# Patient Record
Sex: Female | Born: 1935 | Race: White | Hispanic: No | Marital: Single
Health system: Southern US, Community
[De-identification: ages and names within clinical notes are randomized; demographics above are authoritative.]

## PROBLEM LIST (undated history)

## (undated) DIAGNOSIS — M797 Fibromyalgia: Secondary | ICD-10-CM

## (undated) DIAGNOSIS — F0390 Unspecified dementia without behavioral disturbance: Secondary | ICD-10-CM

## (undated) DIAGNOSIS — E119 Type 2 diabetes mellitus without complications: Secondary | ICD-10-CM

## (undated) HISTORY — PX: ROTATOR CUFF REPAIR: SHX139

---

## 2000-05-23 ENCOUNTER — Encounter: Payer: Self-pay | Admitting: Emergency Medicine

## 2000-05-23 ENCOUNTER — Emergency Department (HOSPITAL_COMMUNITY): Admission: EM | Admit: 2000-05-23 | Discharge: 2000-05-23 | Payer: Self-pay | Admitting: Emergency Medicine

## 2009-12-26 ENCOUNTER — Inpatient Hospital Stay (HOSPITAL_COMMUNITY): Admission: AD | Admit: 2009-12-26 | Discharge: 2009-12-31 | Payer: Self-pay

## 2010-06-19 LAB — CBC
HCT: 35.1 % — ABNORMAL LOW (ref 36.0–46.0)
Hemoglobin: 11.5 g/dL — ABNORMAL LOW (ref 12.0–15.0)
MCHC: 32.8 g/dL (ref 30.0–36.0)
MCHC: 33.3 g/dL (ref 30.0–36.0)
MCV: 83.2 fL (ref 78.0–100.0)
Platelets: 288 10*3/uL (ref 150–400)
RBC: 4.19 MIL/uL (ref 3.87–5.11)
RDW: 12.6 % (ref 11.5–15.5)
WBC: 13.6 10*3/uL — ABNORMAL HIGH (ref 4.0–10.5)

## 2010-06-19 LAB — GLUCOSE, CAPILLARY
Glucose-Capillary: 107 mg/dL — ABNORMAL HIGH (ref 70–99)
Glucose-Capillary: 111 mg/dL — ABNORMAL HIGH (ref 70–99)
Glucose-Capillary: 131 mg/dL — ABNORMAL HIGH (ref 70–99)
Glucose-Capillary: 136 mg/dL — ABNORMAL HIGH (ref 70–99)
Glucose-Capillary: 139 mg/dL — ABNORMAL HIGH (ref 70–99)
Glucose-Capillary: 141 mg/dL — ABNORMAL HIGH (ref 70–99)
Glucose-Capillary: 145 mg/dL — ABNORMAL HIGH (ref 70–99)
Glucose-Capillary: 146 mg/dL — ABNORMAL HIGH (ref 70–99)
Glucose-Capillary: 151 mg/dL — ABNORMAL HIGH (ref 70–99)
Glucose-Capillary: 174 mg/dL — ABNORMAL HIGH (ref 70–99)
Glucose-Capillary: 193 mg/dL — ABNORMAL HIGH (ref 70–99)

## 2010-06-19 LAB — BASIC METABOLIC PANEL
BUN: 12 mg/dL (ref 6–23)
BUN: 7 mg/dL (ref 6–23)
BUN: 9 mg/dL (ref 6–23)
BUN: 9 mg/dL (ref 6–23)
CO2: 29 mEq/L (ref 19–32)
Chloride: 100 mEq/L (ref 96–112)
Chloride: 93 mEq/L — ABNORMAL LOW (ref 96–112)
Chloride: 94 mEq/L — ABNORMAL LOW (ref 96–112)
Creatinine, Ser: 0.58 mg/dL (ref 0.4–1.2)
Creatinine, Ser: 0.63 mg/dL (ref 0.4–1.2)
GFR calc Af Amer: >60 mL/min (ref >60)
GFR calc Af Amer: >60 mL/min (ref >60)
GFR calc non Af Amer: >60 mL/min (ref >60)
GFR calc non Af Amer: >60 mL/min (ref >60)
GFR calc non Af Amer: >60 mL/min (ref >60)
GFR calc non Af Amer: >60 mL/min (ref >60)
Glucose, Bld: 113 mg/dL — ABNORMAL HIGH (ref 70–99)
Glucose, Bld: 121 mg/dL — ABNORMAL HIGH (ref 70–99)
Glucose, Bld: 138 mg/dL — ABNORMAL HIGH (ref 70–99)
Potassium: 3.7 mEq/L (ref 3.5–5.1)
Potassium: 3.7 mEq/L (ref 3.5–5.1)
Potassium: 4 mEq/L (ref 3.5–5.1)
Potassium: 4.6 mEq/L (ref 3.5–5.1)
Sodium: 133 mEq/L — ABNORMAL LOW (ref 135–145)
Sodium: 135 mEq/L (ref 135–145)

## 2010-06-19 LAB — MRSA PCR SCREENING: MRSA by PCR: NEGATIVE

## 2010-08-22 NOTE — Consult Note (Signed)
[place]  Patient:    Marilyn Stone, Marilyn Stone                       MRN: [id number] Proc. Date: 05/23/00 Attending:  Marlan Palau, M.D.                          Consultation Report  HISTORY OF PRESENT ILLNESS:  Marilyn Stone is a 76 year old right-handed white female, born Jan 04, 1929 with a history of obesity and hypertension.  This patient comes to the [facility] for an evaluation of an episode that occurred this evening while at a restaurant. The patient actually was having lunch at a steakhouse around 1 p.m. today. She walked into the restaurant, but apparently tripped on the way to the table.  The patient did a split with the left leg behind her, stretching the hamstrings with significant pain in the thigh.  The patient needed assistance to get up after the fall, was able to limp over to a booth, and five minutes later, developed problems with feeling light-headed, blanched in the face, and some perioral paraseizures as if her mouth felt like it was "shot with Novocaine."  Around this time, patient was slurring her speech with no obvious facial droop apparent to the family.  Patient did not have any numbness or weakness on the arms or legs at this time, did not black out.  The event resolved in about five minutes, but patient had associated diaphoresis with the event without chest pains.  Patient has been completely normal since that time and comes to the emergency room for an evaluation.  CT scan of the brain is unremarkable.  Neurology was asked to see the patient for further evaluation.  PAST MEDICAL HISTORY: 1. History of hypertension. 2. History of fibromyalgia. 3. History of obesity. 4. Status post hysterectomy. 5. History of left rotator cuff tear repair in the past.  MEDICATIONS: 1. Vasotec 5 mg b.i.d. 2. Amitriptyline 50 mg q.h.s. 3. Vicodin at night.  ALLERGIES:  Patient states an allergy to STREPTOMYCIN and  PARAFON FORTE.  HABITS:  He does not smoke or drink.  SOCIAL HISTORY:  Patient is married, lives in [place], [place] area. She has two sons, one son with an unknown liver disease and one son with hypertension.  FAMILY MEDICAL HISTORY:  Notable that mother died with an MI.  Father died with cancer of the liver.  Patient has one sister that died at age 54 with congestive heart failure, one brother alive at age 60 with history of coronary artery disease.  REVIEW OF SYSTEMS:  Notable for no fevers or chills.  Patient denies chest pains, palpitations, denies headache, visual field changes, has occasional shortness of breath, has some recent diarrhea last evening.  She has frequency of the bladder, no bladder control problems.  PHYSICAL EXAMINATION:  VITAL SIGNS:  Blood pressure is 137/47, heart rate is 92, respiratory rate 18, temperature afebrile.  GENERAL:  In general the patient is a moderately obese white female who was alert and cooperative at the time of the examination.  HEENT:  Head is atraumatic.  Eyes:  Pupils are equal, round, and reactive to light.  Discs soft, flat bilaterally.  NECK:  Supple, no carotid bruits noted.  RESPIRATORY:  Clear to auscultation and percussion.  CARDIOVASCULAR:  Reveals regular rate and rhythm without obvious murmurs or rubs noted.  EXTREMITIES:  Without significant  edema.  NEUROLOGIC:  Cranial nerves as above.  Facial symmetry is present.  Patient has good sensation to pinprick and soft touch bilaterally.  Patient has good strength of facial muscles and the muscles of head turning and shoulder shrug bilaterally.  Patient has a normal speech pattern, no aphasia.  Motor test reveals 5/5 strength in all fours.  Good symmetric motility _________ . Sensory testing was intact to pinprick and soft touch bilaterally, _________ throughout.  Finger-nose-finger and toe-to-finger symmetric and normal.  Deep tendon reflexes repressed but  symmetric.  Toes neutral to downgoing bilaterally.  No pronator drift again is seen.  LABORATORY VALUES:  Notable for white count of 9.8, hemoglobin of 12.2, hematocrit 36.7, MCV of 8, platelets of 384.  Pro time is INR of 1.1.  EKG reveals normal sinus rhythm, anterior infarct, age undetermined, heart rate of 98.  CT scan of the head is unremarkable without contrast.  IMPRESSION: 1. History of recent fall. 2. Near syncopal event. 3. History of hypertension.  This patient is normal neurologic at this point.  By history, the patient fell and sustained a stretch injury to the hamstring muscles on the left thigh. Patient then subsequently had a presyncopal event associated with light-headed sensations, blanching of the face, diaphoresis, perioral numbness.  Slurred speech was part of this process.  The episode completely resolved in 5-10 minutes.  The patient is now back to normal.  My index of suspicion for TIA is quite low at this point.  Would discharge the patient back home tonight.  Will pursue an outpatient workup for an MRI scan and MRI angiogram.  Patient did not have slurred speech initially after the fall.  The patient did not have any weakness or numbness of the extremities.  The presyncopal event was associated with significant pain of the left thigh.  PLAN: 1. Discharge to home tonight. 2. Continue taking an aspirin a day. 3. MRI scan of the brain, MRI injury on his outpatient. 4. Will follow up with [facility] in three weeks following    discharge. DD:  05/23/00 TD:  05/23/00 Job: [id number]

## 2012-11-09 ENCOUNTER — Encounter (HOSPITAL_COMMUNITY): Payer: Self-pay | Admitting: Emergency Medicine

## 2012-11-09 ENCOUNTER — Emergency Department (HOSPITAL_COMMUNITY): Payer: Medicare PPO

## 2012-11-09 ENCOUNTER — Inpatient Hospital Stay (HOSPITAL_COMMUNITY)
Admission: EM | Admit: 2012-11-09 | Discharge: 2012-11-11 | DRG: 684 | Disposition: A | Discharge status: Home / Self Care | Payer: Medicare PPO | Attending: Internal Medicine | Admitting: Internal Medicine

## 2012-11-09 ENCOUNTER — Inpatient Hospital Stay (HOSPITAL_COMMUNITY): Payer: Medicare PPO

## 2012-11-09 DIAGNOSIS — IMO0001 Reserved for inherently not codable concepts without codable children: Secondary | ICD-10-CM | POA: Diagnosis present

## 2012-11-09 DIAGNOSIS — R197 Diarrhea, unspecified: Secondary | ICD-10-CM | POA: Diagnosis present

## 2012-11-09 DIAGNOSIS — I959 Hypotension, unspecified: Secondary | ICD-10-CM | POA: Diagnosis present

## 2012-11-09 DIAGNOSIS — N179 Acute kidney failure, unspecified: Principal | ICD-10-CM | POA: Diagnosis present

## 2012-11-09 DIAGNOSIS — E860 Dehydration: Secondary | ICD-10-CM | POA: Diagnosis present

## 2012-11-09 DIAGNOSIS — R55 Syncope and collapse: Secondary | ICD-10-CM | POA: Diagnosis present

## 2012-11-09 DIAGNOSIS — K219 Gastro-esophageal reflux disease without esophagitis: Secondary | ICD-10-CM | POA: Diagnosis present

## 2012-11-09 DIAGNOSIS — E119 Type 2 diabetes mellitus without complications: Secondary | ICD-10-CM | POA: Diagnosis present

## 2012-11-09 DIAGNOSIS — Z87820 Personal history of traumatic brain injury: Secondary | ICD-10-CM

## 2012-11-09 DIAGNOSIS — F0390 Unspecified dementia without behavioral disturbance: Secondary | ICD-10-CM | POA: Diagnosis present

## 2012-11-09 HISTORY — DX: Fibromyalgia: M79.7

## 2012-11-09 HISTORY — DX: Type 2 diabetes mellitus without complications: E11.9

## 2012-11-09 HISTORY — DX: Unspecified dementia, unspecified severity, without behavioral disturbance, psychotic disturbance, mood disturbance, and anxiety: F03.90

## 2012-11-09 LAB — CBC WITH DIFFERENTIAL/PLATELET
Eosinophils Relative: 1 % (ref 0–5)
HCT: 30.8 % — ABNORMAL LOW (ref 36.0–46.0)
Lymphocytes Relative: 21 % (ref 12–46)
Lymphs Abs: 2.2 10*3/uL (ref 0.7–4.0)
MCV: 82.8 fL (ref 78.0–100.0)
Monocytes Absolute: 0.6 10*3/uL (ref 0.1–1.0)
RBC: 3.72 MIL/uL — ABNORMAL LOW (ref 3.87–5.11)
WBC: 10.5 10*3/uL (ref 4.0–10.5)

## 2012-11-09 LAB — URINE MICROSCOPIC-ADD ON

## 2012-11-09 LAB — COMPREHENSIVE METABOLIC PANEL
ALT: 15 U/L (ref 0–35)
Alkaline Phosphatase: 61 U/L (ref 39–117)
BUN: 43 mg/dL — ABNORMAL HIGH (ref 6–23)
CO2: 24 mEq/L (ref 19–32)
GFR calc Af Amer: 35 mL/min — ABNORMAL LOW (ref >90)
GFR calc non Af Amer: 30 mL/min — ABNORMAL LOW (ref >90)
Glucose, Bld: 203 mg/dL — ABNORMAL HIGH (ref 70–99)
Potassium: 3.6 mEq/L (ref 3.5–5.1)
Sodium: 138 mEq/L (ref 135–145)
Total Bilirubin: 0.1 mg/dL — ABNORMAL LOW (ref 0.3–1.2)

## 2012-11-09 LAB — URINALYSIS, ROUTINE W REFLEX MICROSCOPIC
Bilirubin Urine: NEGATIVE
Ketones, ur: NEGATIVE mg/dL
Nitrite: NEGATIVE
Protein, ur: NEGATIVE mg/dL
Urobilinogen, UA: 0.2 mg/dL (ref 0.0–1.0)

## 2012-11-09 MED ORDER — DONEPEZIL HCL 10 MG PO TABS
10.0000 mg | ORAL_TABLET | Freq: Every day | ORAL | Status: DC
  Administered 2012-11-10 – 2012-11-11 (×2): 10 mg via ORAL
  Filled 2012-11-09 (×2): qty 1

## 2012-11-09 MED ORDER — HYDROCODONE-ACETAMINOPHEN 5-325 MG PO TABS
1.0000 | ORAL_TABLET | Freq: Every day | ORAL | Status: DC
  Administered 2012-11-09 – 2012-11-10 (×2): 1 via ORAL
  Filled 2012-11-09 (×2): qty 1

## 2012-11-09 MED ORDER — ONDANSETRON HCL 4 MG/2ML IJ SOLN: 4.0000 mg | Freq: Three times a day (TID) | INTRAMUSCULAR | Status: DC | PRN

## 2012-11-09 MED ORDER — HYDROCODONE-ACETAMINOPHEN 5-325 MG PO TABS: 1.0000 | ORAL_TABLET | Freq: Four times a day (QID) | ORAL | Status: DC | PRN

## 2012-11-09 MED ORDER — ENOXAPARIN SODIUM 30 MG/0.3ML ~~LOC~~ SOLN
30.0000 mg | SUBCUTANEOUS | Status: DC
  Administered 2012-11-09 – 2012-11-10 (×2): 30 mg via SUBCUTANEOUS
  Filled 2012-11-09 (×3): qty 0.3

## 2012-11-09 MED ORDER — METOPROLOL SUCCINATE ER 50 MG PO TB24
50.0000 mg | ORAL_TABLET | Freq: Two times a day (BID) | ORAL | Status: DC
  Administered 2012-11-10 – 2012-11-11 (×3): 50 mg via ORAL
  Filled 2012-11-09 (×4): qty 1

## 2012-11-09 MED ORDER — SODIUM CHLORIDE 0.9 % IV SOLN
INTRAVENOUS | Status: DC
  Administered 2012-11-09 – 2012-11-10 (×2): via INTRAVENOUS

## 2012-11-09 MED ORDER — LINAGLIPTIN 5 MG PO TABS
5.0000 mg | ORAL_TABLET | Freq: Every day | ORAL | Status: DC
  Administered 2012-11-10 – 2012-11-11 (×2): 5 mg via ORAL
  Filled 2012-11-09 (×2): qty 1

## 2012-11-09 MED ORDER — ASPIRIN 81 MG PO CHEW
81.0000 mg | CHEWABLE_TABLET | Freq: Every day | ORAL | Status: DC
  Administered 2012-11-09 – 2012-11-11 (×3): 81 mg via ORAL
  Filled 2012-11-09 (×3): qty 1

## 2012-11-09 MED ORDER — ONDANSETRON HCL 4 MG PO TABS: 4.0000 mg | ORAL_TABLET | Freq: Four times a day (QID) | ORAL | Status: DC | PRN

## 2012-11-09 MED ORDER — SODIUM CHLORIDE 0.9 % IV SOLN
1000.0000 mL | INTRAVENOUS | Status: DC
  Administered 2012-11-09: 1000 mL via INTRAVENOUS

## 2012-11-09 MED ORDER — ACETAMINOPHEN 325 MG PO TABS: 650.0000 mg | ORAL_TABLET | Freq: Four times a day (QID) | ORAL | Status: DC | PRN

## 2012-11-09 MED ORDER — MECLIZINE HCL 25 MG PO TABS
25.0000 mg | ORAL_TABLET | Freq: Three times a day (TID) | ORAL | Status: DC
  Administered 2012-11-09 – 2012-11-11 (×5): 25 mg via ORAL
  Filled 2012-11-09 (×7): qty 1

## 2012-11-09 MED ORDER — SIMVASTATIN 20 MG PO TABS
20.0000 mg | ORAL_TABLET | Freq: Every day | ORAL | Status: DC
  Administered 2012-11-10: 20 mg via ORAL
  Filled 2012-11-09 (×2): qty 1

## 2012-11-09 MED ORDER — CITALOPRAM HYDROBROMIDE 40 MG PO TABS
40.0000 mg | ORAL_TABLET | Freq: Every day | ORAL | Status: DC
  Administered 2012-11-10 – 2012-11-11 (×2): 40 mg via ORAL
  Filled 2012-11-09 (×3): qty 1

## 2012-11-09 MED ORDER — PANTOPRAZOLE SODIUM 40 MG PO TBEC
40.0000 mg | DELAYED_RELEASE_TABLET | Freq: Every day | ORAL | Status: DC
  Administered 2012-11-10 – 2012-11-11 (×2): 40 mg via ORAL
  Filled 2012-11-09 (×2): qty 1

## 2012-11-09 MED ORDER — ACETAMINOPHEN 650 MG RE SUPP: 650.0000 mg | Freq: Four times a day (QID) | RECTAL | Status: DC | PRN

## 2012-11-09 MED ORDER — SODIUM CHLORIDE 0.9 % IJ SOLN
3.0000 mL | Freq: Two times a day (BID) | INTRAMUSCULAR | Status: DC
  Administered 2012-11-10: 3 mL via INTRAVENOUS

## 2012-11-09 MED ORDER — INSULIN ASPART 100 UNIT/ML ~~LOC~~ SOLN
0.0000 [IU] | Freq: Three times a day (TID) | SUBCUTANEOUS | Status: DC
  Administered 2012-11-10: 2 [IU] via SUBCUTANEOUS
  Administered 2012-11-10 – 2012-11-11 (×2): 1 [IU] via SUBCUTANEOUS

## 2012-11-09 MED ORDER — GLIMEPIRIDE 4 MG PO TABS
4.0000 mg | ORAL_TABLET | Freq: Two times a day (BID) | ORAL | Status: DC
  Administered 2012-11-10 – 2012-11-11 (×3): 4 mg via ORAL
  Filled 2012-11-09 (×6): qty 1

## 2012-11-09 MED ORDER — ONDANSETRON HCL 4 MG/2ML IJ SOLN: 4.0000 mg | Freq: Four times a day (QID) | INTRAMUSCULAR | Status: DC | PRN

## 2012-11-09 MED ORDER — MEMANTINE HCL ER 28 MG PO CP24
28.0000 mg | ORAL_CAPSULE | Freq: Every day | ORAL | Status: DC
  Administered 2012-11-09 – 2012-11-11 (×3): 28 mg via ORAL
  Filled 2012-11-09 (×3): qty 28

## 2012-11-09 NOTE — Progress Notes (Signed)
Pt admitted to unit from ED. Pt is A&O, VS stable, skin intact. Pt is currently resting comfortably in bed. Will continue to monitor.  

## 2012-11-09 NOTE — ED Notes (Signed)
Per EMS: pt went to bathroom at DRs office; pt had episode of diarrhea and came out very white and had near syncopal episode; pts daughter in law caught her and helped her to ground. Pt did not hit head and did not have total LOC. Pt c/o nausea, diarrhea, and abdominal pain with dizziness. Pt was in trendelenburg upon EMS arrival and pt was very pale upon arrival. VS BP 107/47 HR 64 97% Spo2 CBG 144

## 2012-11-09 NOTE — ED Notes (Signed)
MD at bedside. 

## 2012-11-09 NOTE — H&P (Addendum)
[facility] History and Physical  Marilyn Stone WUJ:[id number] DOB: 09-06-1928 DOA: 11/09/2012  Referring physician:  PCP: Paulina Fusi, MD  Specialists:   Chief Complaint: diarrhea and presyncope  HPI: Marilyn Stone is a 79 y.o. female with PMH as listed below who presents with the above complaints. She states that today she had a nonbloody diarrheal BM and following that she was felt light headed, diaphoretic and 'slid down to the floor'. She states she never lost consciousness. She admits to lower abd pain after the BM. She denies vomiting, chest pain, fever and no dysuria. Per EDP pt was hypotensive with BP in the 80s when EMS arrived. She was given IVF and upon arrival in ED BP was in the 120s and remained stable. In the ED labs revealed an elevated cr of 1.57, CXR neg, EKG with nonspecific t wave changes mostly laterally, troponin neg. She is admitted for further eval and management    Review of Systems: The patient denies anorexia, fever, weight loss,, vision loss, decreased hearing, hoarseness, chest pain, syncope, dyspnea on exertion, peripheral edema, balance deficits, hemoptysis, melena, hematochezia, severe indigestion/heartburn, hematuria, incontinence, suspicious skin lesions, transient blindness, , unusual weight change, abnormal bleeding, enlarged lymph nodes, angioedema, and breast masses.    Past Medical History  Diagnosis Date  . Diabetes mellitus without complication   . Dementia   . Fibromyalgia    Past Surgical History  Procedure Laterality Date  . Rotator cuff repair     Social History:  has no tobacco, alcohol, and drug history on file. where does patient live--home   No Known Allergies  Family history-  her mother had stroke  Prior to Admission medications   Medication Sig Start Date End Date Taking? Authorizing Provider  citalopram (CELEXA) 40 MG tablet Take 40 mg by mouth daily.   Yes Historical Provider, MD  Cyanocobalamin 1000 MCG/ML LIQD  Take 1,000 mcg/mL by mouth every 30 (thirty) days.   Yes Historical Provider, MD  donepezil (ARICEPT) 10 MG tablet Take 10 mg by mouth daily.   Yes Historical Provider, MD  furosemide (LASIX) 40 MG tablet Take 40 mg by mouth daily.   Yes Historical Provider, MD  glimepiride (AMARYL) 4 MG tablet Take 4 mg by mouth 2 (two) times daily.   Yes Historical Provider, MD  glucosamine-chondroitin 500-400 MG tablet Take 1 tablet by mouth 4 (four) times daily.   Yes Historical Provider, MD  HYDROcodone-acetaminophen (NORCO/VICODIN) 5-325 MG per tablet Take 1 tablet by mouth at bedtime.   Yes Historical Provider, MD  ibuprofen (ADVIL,MOTRIN) 200 MG tablet Take 200 mg by mouth every 6 (six) hours as needed for pain.   Yes Historical Provider, MD  lovastatin (MEVACOR) 40 MG tablet Take 40 mg by mouth 2 (two) times daily.   Yes Historical Provider, MD  meclizine (ANTIVERT) 25 MG tablet Take 25 mg by mouth 3 (three) times daily.   Yes Historical Provider, MD  Memantine HCl ER (NAMENDA XR) 28 MG CP24 Take 28 mg by mouth daily.   Yes Historical Provider, MD  metFORMIN (GLUMETZA) 500 MG (MOD) 24 hr tablet Take 500 mg by mouth daily with breakfast.   Yes Historical Provider, MD  metoprolol succinate (TOPROL-XL) 100 MG 24 hr tablet Take 100 mg by mouth 2 (two) times daily. Take with or immediately following a meal.   Yes Historical Provider, MD  Multiple Vitamins-Minerals (CENTRUM SILVER PO) Take 1 tablet by mouth daily.   Yes Historical Provider, MD  Omega-3 Fatty Acids (FISH  OIL PO) Take 1 tablet by mouth daily.   Yes Historical Provider, MD  omeprazole (PRILOSEC) 20 MG capsule Take 20 mg by mouth daily.   Yes Historical Provider, MD  polyethylene glycol (MIRALAX / GLYCOLAX) packet Take 17 g by mouth daily as needed (constipation).   Yes Historical Provider, MD  sitaGLIPtin (JANUVIA) 100 MG tablet Take 50 mg by mouth daily.   Yes Historical Provider, MD  valsartan-hydrochlorothiazide (DIOVAN-HCT) 320-25 MG per tablet  Take 1 tablet by mouth daily.   Yes Historical Provider, MD   Physical Exam: Filed Vitals:   11/09/12 1845  BP: 120/55  Pulse: 65  Temp:   Resp:     Constitutional: Vital signs reviewed.  Patient is well-developed and obese in no acute distress and cooperative with exam. Alert and oriented x3.  Head: Normocephalic and atraumatic Nose: No erythema or drainage noted. Mouth: no erythema or exudates, slightly dry MM Eyes: PERRL, EOMI, conjunctivae normal, No scleral icterus.  Neck: Supple, Trachea midline normal ROM, No JVD, mass, thyromegaly, or carotid bruit present.  Cardiovascular: RRR, S1 normal, S2 normal, no MRG, pulses symmetric and intact bilaterally Pulmonary/Chest: normal respiratory effort, CTAB, no wheezes, rales, or rhonchi Abdominal: Soft. Lower abd tenderness, no rebound, non-distended, bowel sounds are normal, no masses, organomegaly, or guarding present.  GU: no CVA tenderness Extremities:no cyanosis and no edema Neurological: A&O x3, Strength is normal and symmetric bilaterally, cranial nerve II-XII are grossly intact, no focal motor deficit, sensory intact to light touch bilaterally.  Skin: Warm, dry and intact. No rash.  Psychiatric: Normal mood and affect.   Labs on Admission:  Basic Metabolic Panel:  Recent Labs Lab 11/09/12 1706  NA 138  K 3.6  CL 96  CO2 24  GLUCOSE 203*  BUN 43*  CREATININE 1.57*  CALCIUM 9.2   Liver Function Tests:  Recent Labs Lab 11/09/12 1706  AST 24  ALT 15  ALKPHOS 61  BILITOT 0.1*  PROT 7.3  ALBUMIN 3.8   No results found for this basename: LIPASE, AMYLASE,  in the last 168 hours No results found for this basename: AMMONIA,  in the last 168 hours CBC:  Recent Labs Lab 11/09/12 1744  WBC 10.5  NEUTROABS 7.8*  HGB 10.3*  HCT 30.8*  MCV 82.8  PLT 280   Cardiac Enzymes: No results found for this basename: CKTOTAL, CKMB, CKMBINDEX, TROPONINI,  in the last 168 hours  BNP (last 3 results) No results found  for this basename: PROBNP,  in the last 8760 hours CBG: No results found for this basename: GLUCAP,  in the last 168 hours  Radiological Exams on Admission: Dg Chest 2 View  11/09/2012   *RADIOLOGY REPORT*  Clinical Data: Near syncopal episode  CHEST - 2 VIEW  Comparison: 08/29/2010  Findings: The heart and pulmonary vascularity are within normal limits.  The lungs are clear bilaterally.  A chronic compression deformity is noted in the lower thoracic spine.  IMPRESSION: No acute abnormality noted.   Original Report Authenticated By: Alcide Clever, M.D.    EKG: Independently reviewed. NSR mostly lateral nonspecific t wave inversion  Assessment/Plan Active Problems: Transient hypotension -likely due to hypovolemia/volume depletion due to diarrhea and meds -resolved with hydration- continue IVF - hold bp meds today, resume metoprolol at decreased dose in am, monitor and further treat accordingly -UA contaminated specimen, obtain culture and follow, she is afebrile with no leukocytosis  -cycle troponins -Hgb stable, follow   Diarrhea with abd pain -check c.diff and culture  if further diarrhea   Near syncope -likely secondary to above vs vasovagal -hydrate, CEs as above -also check echo echo and follow -obtain abd x-ray, follow and if not improving consider CT abd with contrastwhen cr improved Abnl EKG -she is cp free and troponin neg x1 -cycle troponins,ECHO in am, repeat EKG in am -possibly due to demand induced secondary to hypotension   AKI (acute kidney injury) -secondary to hypotension/vol depletion/ARB/diuretics/NSAIDS -hold metformin for now, also hold ARB/diuretics and NSAIDs -follow and recheck    DM (diabetes mellitus) -continue oral meds except for metformin(due to increased Cr) -cover with SSI -monitor accuchecks, cover with SSI Dementia -continue outpt meds GERD -continue PPI H/O BPV -continue meclizine  H/o Traumatic brain injury with interventricular  hemorrhage     Code Status: full Family Communication: none at bedside Disposition Plan:   Time spent: >30  Kela Millin [facility] Pager [phone]  If 7PM-7AM, please contact night-coverage [email or web address] Password [facility] 11/09/2012, 7:13 PM

## 2012-11-09 NOTE — ED Notes (Signed)
Paged Dr, Yetta Barre to [phone]

## 2012-11-09 NOTE — ED Provider Notes (Signed)
CSN: [id number]     Arrival date & time 11/09/12  1514 History     First MD Initiated Contact with Patient 11/09/12 1515     Chief Complaint  Patient presents with  . Near Syncope  . Nausea  . Diarrhea   (Consider location/radiation/quality/duration/timing/severity/associated sxs/prior Treatment) Patient is a 79 y.o. female presenting with diarrhea. The history is provided by the patient and medical records. No language interpreter was used.  Diarrhea Associated symptoms: abdominal pain and diaphoresis   Associated symptoms: no fever, no headaches and no vomiting     Marilyn Stone is a 79 y.o. female  with a hx of DM, dementia, fibromyalgia, hysterectomy presents to the Emergency Department complaining of acute resolved near syncopal episode approx 30 min prior to arrival after having 1 bout of diarrhea.  Pt denies feeling ill prior to the episode and has not had further or previous episodes of diarrhea.  Pr denies vomiting but endorses mild, generalized abd pain.  No abd surgical hx besides the hysterectomy.  Associated symptoms include diaphoresis, lightheadedness, weakness, nausea.  Nothing makes it better and nothing makes it worse.  Pt denies fever, chills, headache, neck pain, chest pain, SOB, vomiting, dizziness, dysuria, hematuria, hematochezia.      Past Medical History  Diagnosis Date  . Diabetes mellitus without complication   . Dementia   . Fibromyalgia    Past Surgical History  Procedure Laterality Date  . Rotator cuff repair     History reviewed. No pertinent family history. History  Substance Use Topics  . Smoking status: Not on file  . Smokeless tobacco: Not on file  . Alcohol Use: Not on file   OB History   Grav Para Term Preterm Abortions TAB SAB Ect Mult Living                 Review of Systems  Constitutional: Positive for diaphoresis. Negative for fever, appetite change, fatigue and unexpected weight change.  HENT: Negative for mouth sores and neck  stiffness.   Eyes: Negative for visual disturbance.  Respiratory: Negative for cough, chest tightness, shortness of breath and wheezing.   Cardiovascular: Negative for chest pain.  Gastrointestinal: Positive for nausea, abdominal pain and diarrhea. Negative for vomiting and constipation.  Endocrine: Negative for polydipsia, polyphagia and polyuria.  Genitourinary: Negative for dysuria, urgency, frequency and hematuria.  Musculoskeletal: Negative for back pain.  Skin: Negative for rash.  Allergic/Immunologic: Negative for immunocompromised state.  Neurological: Positive for syncope and light-headedness. Negative for headaches.  Hematological: Does not bruise/bleed easily.  Psychiatric/Behavioral: Negative for sleep disturbance. The patient is not nervous/anxious.     Allergies  Review of patient's allergies indicates no known allergies.  Home Medications   Current Outpatient Rx  Name  Route  Sig  Dispense  Refill  . citalopram (CELEXA) 40 MG tablet   Oral   Take 40 mg by mouth daily.         . Cyanocobalamin 1000 MCG/ML LIQD   Oral   Take 1,000 mcg/mL by mouth every 30 (thirty) days.         Marland Kitchen donepezil (ARICEPT) 10 MG tablet   Oral   Take 10 mg by mouth daily.         . furosemide (LASIX) 40 MG tablet   Oral   Take 40 mg by mouth daily.         Marland Kitchen glimepiride (AMARYL) 4 MG tablet   Oral   Take 4 mg by mouth 2 (two)  times daily.         Marland Kitchen glucosamine-chondroitin 500-400 MG tablet   Oral   Take 1 tablet by mouth 4 (four) times daily.         Marland Kitchen HYDROcodone-acetaminophen (NORCO/VICODIN) 5-325 MG per tablet   Oral   Take 1 tablet by mouth at bedtime.         Marland Kitchen ibuprofen (ADVIL,MOTRIN) 200 MG tablet   Oral   Take 200 mg by mouth every 6 (six) hours as needed for pain.         Marland Kitchen lovastatin (MEVACOR) 40 MG tablet   Oral   Take 40 mg by mouth 2 (two) times daily.         . meclizine (ANTIVERT) 25 MG tablet   Oral   Take 25 mg by mouth 3 (three)  times daily.         . Memantine HCl ER (NAMENDA XR) 28 MG CP24   Oral   Take 28 mg by mouth daily.         . metFORMIN (GLUMETZA) 500 MG (MOD) 24 hr tablet   Oral   Take 500 mg by mouth daily with breakfast.         . metoprolol succinate (TOPROL-XL) 100 MG 24 hr tablet   Oral   Take 100 mg by mouth 2 (two) times daily. Take with or immediately following a meal.         . Multiple Vitamins-Minerals (CENTRUM SILVER PO)   Oral   Take 1 tablet by mouth daily.         . Omega-3 Fatty Acids (FISH OIL PO)   Oral   Take 1 tablet by mouth daily.         Marland Kitchen omeprazole (PRILOSEC) 20 MG capsule   Oral   Take 20 mg by mouth daily.         . polyethylene glycol (MIRALAX / GLYCOLAX) packet   Oral   Take 17 g by mouth daily as needed (constipation).         . sitaGLIPtin (JANUVIA) 100 MG tablet   Oral   Take 50 mg by mouth daily.         . valsartan-hydrochlorothiazide (DIOVAN-HCT) 320-25 MG per tablet   Oral   Take 1 tablet by mouth daily.          BP 114/49  Pulse 70  Temp(Src) 98.1 F (36.7 C) (Oral)  Resp 14  SpO2 99% Physical Exam  Nursing note and vitals reviewed. Constitutional: She appears well-developed and well-nourished. No distress.  Awake, alert No longer diaphoretic, but still wet from diaphoretic episode  HENT:  Head: Normocephalic and atraumatic.  Right Ear: External ear normal.  Left Ear: External ear normal.  Mouth/Throat: Oropharynx is clear and moist. No oropharyngeal exudate.  Eyes: Conjunctivae and EOM are normal. Pupils are equal, round, and reactive to light. No scleral icterus.  Neck: Normal range of motion. Neck supple.  Cardiovascular: Normal rate, regular rhythm, normal heart sounds and intact distal pulses.   No murmur heard. Pulmonary/Chest: Effort normal and breath sounds normal. No respiratory distress. She has no wheezes. She has no rales.  Abdominal: Soft. Bowel sounds are normal. She exhibits no distension, no  pulsatile midline mass and no mass. There is generalized tenderness (mild, generalized). There is no rigidity, no rebound, no guarding and no CVA tenderness.  Musculoskeletal: Normal range of motion. She exhibits no edema.  Lymphadenopathy:    She has no cervical adenopathy.  Neurological: She is alert. She has normal strength. No cranial nerve deficit or sensory deficit. GCS eye subscore is 4. GCS verbal subscore is 5. GCS motor subscore is 6.  Speech is clear and goal oriented Moves extremities without ataxia  Skin: Skin is warm and dry. She is not diaphoretic.  Psychiatric: She has a normal mood and affect.    ED Course   Procedures (including critical care time)  Labs Reviewed  COMPREHENSIVE METABOLIC PANEL - Abnormal; Notable for the following:    Glucose, Bld 203 (*)    BUN 43 (*)    Creatinine, Ser 1.57 (*)    Total Bilirubin 0.1 (*)    GFR calc non Af Amer 30 (*)    GFR calc Af Amer 35 (*)    All other components within normal limits  URINALYSIS, ROUTINE W REFLEX MICROSCOPIC - Abnormal; Notable for the following:    Leukocytes, UA SMALL (*)    All other components within normal limits  CBC WITH DIFFERENTIAL - Abnormal; Notable for the following:    RBC 3.72 (*)    Hemoglobin 10.3 (*)    HCT 30.8 (*)    Neutro Abs 7.8 (*)    All other components within normal limits  URINE MICROSCOPIC-ADD ON - Abnormal; Notable for the following:    Squamous Epithelial / LPF MANY (*)    Casts HYALINE CASTS (*)    All other components within normal limits  CBC WITH DIFFERENTIAL  POCT I-STAT TROPONIN I  POCT CBG (FASTING - GLUCOSE)-MANUAL ENTRY   ECG:  Date: 11/09/2012 @ 1532  Rate: 67  Rhythm: normal sinus rhythm  QRS Axis: normal  Intervals: normal  ST/T Wave abnormalities: nonspecific T wave changes  Conduction Disutrbances:none  Narrative Interpretation: Patient with T wave inversion in inferior and lateral leads, no old for comparison  Old EKG Reviewed: none  available  ECG:  Date: 11/09/2012 @17 :06  Rate: 69  Rhythm: normal sinus rhythm  QRS Axis: normal  Intervals: normal  ST/T Wave abnormalities: nonspecific T wave changes  Conduction Disutrbances:none  Narrative Interpretation: Patient with persistent T wave flattening or inversion in the inferior and lateral leads unchanged from EKG at 1532.  Old EKG Reviewed: unchanged    Dg Chest 2 View  11/09/2012   *RADIOLOGY REPORT*  Clinical Data: Near syncopal episode  CHEST - 2 VIEW  Comparison: 08/29/2010  Findings: The heart and pulmonary vascularity are within normal limits.  The lungs are clear bilaterally.  A chronic compression deformity is noted in the lower thoracic spine.  IMPRESSION: No acute abnormality noted.   Original Report Authenticated By: Alcide Clever, M.D.   1. Syncope   2. AKI (acute kidney injury)   3. DM (diabetes mellitus)     MDM  Marilyn Stone presents with syncope after 1 episode of diarrhea.  Concern for cardiac causes as pt has a Hx of DM, though pt has no Hx of CAD.  Will proceed with work-up.    Initial EGC with inverted t-waves in lateral and inferior leads without an old for comparison.  Will obtain troponin and repeat ECG in several hours.  6:00PM Pt with initial troponin negative.  2 hour ECG unchanged. CBC without leukocytosis, mild anemia at 10.3. Of note CMP shows acute kidney injury with BUN of 43 and creatinine of 1.57. Patient's son reports no known chronic kidney problems.  Elevated from last baseline in 2011. Urinalysis no evidence of urinary tract infection.  Chest x-ray without evidence of pulmonary  edema or pneumonia.  Patient remained alert and oriented, nontoxic, nonseptic appearing. No further syncopal episodes here in the department.  I personally reviewed the imaging tests through PACS system.  I reviewed available ER/hospitalization records through the EMR.    Concern for possible cardiac etiology of syncopal episode.  Pt also with AKI.   Pt has  been re-evaluated prior to consult and VSS, NAD, heart RRR, pain 0/10, lungs CTAB. No acute abnormalities found on EKG and first round of cardiac enzymes negative. This case was discussed with Dr. Rubin Payor who has seen the patient and agrees with plan to admit.   Dierdre Forth, PA-C 11/09/12 1954

## 2012-11-09 NOTE — ED Notes (Signed)
Phlebotomy at bedside.

## 2012-11-10 LAB — COMPREHENSIVE METABOLIC PANEL
ALT: 10 U/L (ref 0–35)
AST: 16 U/L (ref 0–37)
Alkaline Phosphatase: 43 U/L (ref 39–117)
CO2: 28 mEq/L (ref 19–32)
Calcium: 8.1 mg/dL — ABNORMAL LOW (ref 8.4–10.5)
GFR calc Af Amer: 40 mL/min — ABNORMAL LOW (ref >90)
GFR calc non Af Amer: 34 mL/min — ABNORMAL LOW (ref >90)
Glucose, Bld: 105 mg/dL — ABNORMAL HIGH (ref 70–99)
Potassium: 3.4 mEq/L — ABNORMAL LOW (ref 3.5–5.1)
Sodium: 141 mEq/L (ref 135–145)

## 2012-11-10 LAB — GLUCOSE, CAPILLARY
Glucose-Capillary: 108 mg/dL — ABNORMAL HIGH (ref 70–99)
Glucose-Capillary: 161 mg/dL — ABNORMAL HIGH (ref 70–99)
Glucose-Capillary: 139 mg/dL — ABNORMAL HIGH (ref 70–99)

## 2012-11-10 LAB — TROPONIN I: Troponin I: <0.3 ng/mL (ref <0.30)

## 2012-11-10 MED ORDER — METOPROLOL SUCCINATE ER 50 MG PO TB24: 50.0000 mg | ORAL_TABLET | Freq: Two times a day (BID) | ORAL | Status: AC

## 2012-11-10 MED ORDER — FUROSEMIDE 40 MG PO TABS: 20.0000 mg | ORAL_TABLET | Freq: Every day | ORAL | Status: AC

## 2012-11-10 MED ORDER — METRONIDAZOLE 500 MG PO TABS
500.0000 mg | ORAL_TABLET | Freq: Three times a day (TID) | ORAL | Status: DC
  Filled 2012-11-10 (×3): qty 1

## 2012-11-10 MED ORDER — POTASSIUM CHLORIDE CRYS ER 20 MEQ PO TBCR
20.0000 meq | EXTENDED_RELEASE_TABLET | Freq: Once | ORAL | Status: AC
  Administered 2012-11-10: 20 meq via ORAL
  Filled 2012-11-10: qty 1

## 2012-11-10 NOTE — Evaluation (Signed)
Physical Therapy Evaluation Patient Details Name: Marilyn Stone MRN: [id number] DOB: 1928/05/07 Today's Date: 11/10/2012 Time: 8295-6213 PT Time Calculation (min): 18 min  PT Assessment / Plan / Recommendation History of Present Illness  pt presents with Diarrhea and Dehydration.    Clinical Impression  Pt generally weak from diarrhea and dehydration.  Discussed with pt continued use of quad cane and HHPT to Maximize Independence.  No further acute care needs at this time.  Anticipate pt to D/C today.      PT Assessment  All further PT needs can be met in the next venue of care    Follow Up Recommendations  Home health PT;Supervision - Intermittent    Does the patient have the potential to tolerate intense rehabilitation      Barriers to Discharge        Equipment Recommendations  None recommended by PT    Recommendations for Other Services     Frequency      Precautions / Restrictions Precautions Precautions: None Restrictions Weight Bearing Restrictions: No   Pertinent Vitals/Pain Denies pain.        Mobility  Bed Mobility Bed Mobility: Supine to Sit;Sitting - Scoot to Edge of Bed Supine to Sit: 6: Modified independent (Device/Increase time) Sitting - Scoot to Edge of Bed: 6: Modified independent (Device/Increase time) Details for Bed Mobility Assistance: Increased time needed, but able to complete without A.   Transfers Transfers: Sit to Stand;Stand to Sit Sit to Stand: 6: Modified independent (Device/Increase time);With upper extremity assist;From bed Stand to Sit: 6: Modified independent (Device/Increase time);With upper extremity assist;To bed Details for Transfer Assistance: Demos good use of UEs.   Ambulation/Gait Ambulation/Gait Assistance: 5: Supervision Ambulation Distance (Feet): 300 Feet Assistive device: None Ambulation/Gait Assistance Details: pt mildly unsteady, but notes having a Quad cane at home she uses outside and in unfamiliar places.   Discussed with pt use of QC in home upon D/C.   Gait Pattern: Step-through pattern;Decreased stride length Stairs: No Wheelchair Mobility Wheelchair Mobility: No    Exercises     PT Diagnosis:    PT Problem List:   PT Treatment Interventions:       PT Goals(Current goals can be found in the care plan section)    Visit Information  Last PT Received On: 11/10/12 Assistance Needed: +1 History of Present Illness: pt presents with Diarrhea and Dehydration.         Prior Functioning  Home Living Family/patient expects to be discharged to:: Private residence Living Arrangements: Spouse/significant other Available Help at Discharge: Family;Available 24 hours/day (Husband 24/7, Daughter daily) Type of Home: House Home Access: Stairs to enter Secretary/administrator of Steps: 2 Entrance Stairs-Rails: Left Home Layout: One level Home Equipment: Cane - quad Prior Function Level of Independence: Independent with assistive device(s) Communication Communication: No difficulties    Cognition  Cognition Arousal/Alertness: Awake/alert Behavior During Therapy: WFL for tasks assessed/performed Overall Cognitive Status: Within Functional Limits for tasks assessed    Extremity/Trunk Assessment Upper Extremity Assessment Upper Extremity Assessment: Overall WFL for tasks assessed Lower Extremity Assessment Lower Extremity Assessment: Overall WFL for tasks assessed Cervical / Trunk Assessment Cervical / Trunk Assessment: Normal   Balance Balance Balance Assessed: Yes High Level Balance High Level Balance Activites: Direction changes;Turns;Head turns High Level Balance Comments: Mild stagger steps with head turns, but pt maintained balance.    End of Session PT - End of Session Equipment Utilized During Treatment: Gait belt Activity Tolerance: Patient tolerated treatment well Patient left: in  bed;with call bell/phone within reach (Sitting EOB) Nurse Communication: Mobility status   GP     Sunny Schlein, [place] [phone] 11/10/2012, 3:27 PM

## 2012-11-10 NOTE — Discharge Summary (Signed)
Physician Discharge Summary  Marilyn Stone AVW:[id number] DOB: [birth year] DOA: 11/09/2012  PCP: Marilyn Fusi, MD  Admit date: 11/09/2012 Discharge date: 11/10/2012  Time spent: 45 minutes  Recommendations for Outpatient Follow-up:  Home health RN and social worker:  Marilyn Stone has significant ST memory deficits.  Husband at home blind and recently discharged from [facility] stay for cardiac issues.  Follow up with primary care physician.  Marilyn Stone with Presyncope.  BP in Marilyn hospital remained stable on only Metoprolol 50 bid.  Diovan-HCT has been discontinued and her dose of both metroprolol and Lasix have been cut in half.  Please followup on final echocardiogram results at hospital followup with PCP. Check bmet for creatinine at Marilyn time of hospital followup   Discharge Diagnoses:  Active Problems:   AKI (acute kidney injury)   Diarrhea   Near syncope   DM (diabetes mellitus)   Transient hypotension   Discharge Condition: Stable.  With significant short term memory deficits  Diet recommendation: heart healthy  Filed Weights   11/09/12 2107  Weight: 76.4 kg (168 lb 6.9 oz)    History of present illness at Marilyn time of admission:  Marilyn Stone is a 79 y.o. female with a history of DM, dementia, and fibromyalgia  ho presents with diarrhea and presyncope. She states that today she had a nonbloody diarrheal BM and following that she was felt light headed, diaphoretic and 'slid down to Marilyn floor'. She states she never lost consciousness. She admits to lower abd pain after Marilyn BM. She denies vomiting, chest pain, fever and no dysuria. Per EDP pt was hypotensive with BP in Marilyn 80s when EMS arrived. She was given IVF and upon arrival in ED BP was in Marilyn 120s and remained stable. In Marilyn ED labs revealed an elevated cr of 1.57, CXR neg, EKG with nonspecific t wave changes mostly laterally, troponin neg. She is admitted for further eval and management    Hospital Course:  Presyncope with  hypotension Etiology is likely vasovagal. Marilyn Stone was admitted and treated supportively with IVF.  Her Diovan HCT was held, and her metoprolol 100 mg twice a day was cut in half. Physical therapy evaluated her and orthostatic vital signs were checked and found to be normal. She will be discharged on decreased blood pressure medications and asked to followup with her primary care physician regarding her blood pressure.  Diarrhea with abdominal pain Marilyn Stone had no further episodes of diarrhea after admission. Indeed her x-ray shows a significant amount of gas and stool with no blockage. Per her husband she has very irregular bowel movements. C. difficile PCR and stool cultures were not collected as Marilyn Stone had no further bowel movements.  Abnormal EKG Marilyn Stone was noted to have nonspecific T wave abnormalities on EKG. Troponins were cycled and found to be negative. Echocardiogram was performed, results are still pending at Marilyn time of discharge  Acute kidney injury Secondary to hypotension In Marilyn setting of ARB and diuretic. Improved with IV fluids  Diabetes mellitus Outpatient Medications resumed on discharge  Dementia On Aricept. Given her syncope and short-term memory loss and her husband's blindness and recent health problems I have requested a home health RN to visit as well as a Child psychotherapist for Marilyn safety evaluation.  Procedures: 2-D echocardiogram-results pending   Discharge Exam: Filed Vitals:   11/10/12 1415  BP: 134/56  Pulse: 68  Temp:   Resp: 20    General: Well-developed overweight 79 year old female, NAD, significant short  term memory deficit Cardiovascular: Regular rate and rhythm, no murmurs rubs or gallops, no lower extremity edema Respiratory: Clear to auscultation, no wheezes crackles or rales Abdomen: Slightly distended, but soft, mildly tender to palpation particularly in Marilyn left lower quadrant. No masses, positive bowel Extremities: No swelling  cyanosis or edema, able to move all 4 with 5 over 5 strength  Discharge Instructions     Medication List    STOP taking these medications       HYDROcodone-acetaminophen 5-325 MG per tablet  Commonly known as:  NORCO/VICODIN     valsartan-hydrochlorothiazide 320-25 MG per tablet  Commonly known as:  DIOVAN-HCT      TAKE these medications       ANTIVERT 25 MG tablet  Generic drug:  meclizine  Take 25 mg by mouth 3 (three) times daily.     CENTRUM SILVER PO  Take 1 tablet by mouth daily.     citalopram 40 MG tablet  Commonly known as:  CELEXA  Take 40 mg by mouth daily.     Cyanocobalamin 1000 MCG/ML Liqd  Take 1,000 mcg/mL by mouth every 30 (thirty) days.     donepezil 10 MG tablet  Commonly known as:  ARICEPT  Take 10 mg by mouth daily.     FISH OIL PO  Take 1 tablet by mouth daily.     furosemide 40 MG tablet  Commonly known as:  LASIX  Take 0.5 tablets (20 mg total) by mouth daily.     glimepiride 4 MG tablet  Commonly known as:  AMARYL  Take 4 mg by mouth 2 (two) times daily.     glucosamine-chondroitin 500-400 MG tablet  Take 1 tablet by mouth 4 (four) times daily.     ibuprofen 200 MG tablet  Commonly known as:  ADVIL,MOTRIN  Take 200 mg by mouth every 6 (six) hours as needed for pain.     lovastatin 40 MG tablet  Commonly known as:  MEVACOR  Take 40 mg by mouth 2 (two) times daily.     metFORMIN 500 MG (MOD) 24 hr tablet  Commonly known as:  GLUMETZA  Take 500 mg by mouth daily with breakfast.     metoprolol succinate 50 MG 24 hr tablet  Commonly known as:  TOPROL-XL  Take 1 tablet (50 mg total) by mouth 2 (two) times daily. Take with or immediately following a meal.     NAMENDA XR 28 MG Cp24  Generic drug:  Memantine HCl ER  Take 28 mg by mouth daily.     omeprazole 20 MG capsule  Commonly known as:  PRILOSEC  Take 20 mg by mouth daily.     polyethylene glycol packet  Commonly known as:  MIRALAX / GLYCOLAX  Take 17 g by mouth daily  as needed (constipation).     sitaGLIPtin 100 MG tablet  Commonly known as:  JANUVIA  Take 50 mg by mouth daily.       No Known Allergies    Marilyn results of significant diagnostics from this hospitalization (including imaging, microbiology, ancillary and laboratory) are listed below for reference.    Significant Diagnostic Studies: Dg Chest 2 View  11/09/2012   *RADIOLOGY REPORT*  Clinical Data: Near syncopal episode  CHEST - 2 VIEW  Comparison: 08/29/2010  Findings: Marilyn heart and pulmonary vascularity are within normal limits.  Marilyn lungs are clear bilaterally.  A chronic compression deformity is noted in Marilyn lower thoracic spine.  IMPRESSION: No acute abnormality noted.  Original Report Authenticated By: Alcide Clever, M.D.   Dg Abd 2 Views  11/09/2012   *RADIOLOGY REPORT*  Clinical Data: Abdominal pain  ABDOMEN - 2 VIEW  Comparison: 11/09/2012, 03/11/2011  Findings: Cardiomegaly noted.  Lung bases clear.  No free air. Scattered air and stool throughout Marilyn bowel.  Negative for obstruction or ileus.  Degenerative changes of Marilyn lumbar spine and associated levoscoliosis.  No abnormal calcifications or acute osseous finding.  IMPRESSION: Negative for obstruction or free air.   Original Report Authenticated By: Judie Petit. Shick, M.D.     Labs: Basic Metabolic Panel:  Recent Labs Lab 11/09/12 1706 11/10/12 0246  NA 138 141  K 3.6 3.4*  CL 96 105  CO2 24 28  GLUCOSE 203* 105*  BUN 43* 38*  CREATININE 1.57* 1.40*  CALCIUM 9.2 8.1*   Liver Function Tests:  Recent Labs Lab 11/09/12 1706 11/10/12 0246  AST 24 16  ALT 15 10  ALKPHOS 61 43  BILITOT 0.1* 0.2*  PROT 7.3 5.9*  ALBUMIN 3.8 2.9*   CBC:  Recent Labs Lab 11/09/12 1744  WBC 10.5  NEUTROABS 7.8*  HGB 10.3*  HCT 30.8*  MCV 82.8  PLT 280   Cardiac Enzymes:  Recent Labs Lab 11/09/12 2015 11/10/12 0246 11/10/12 0938  TROPONINI <0.30 <0.30 <0.30   CBG:  Recent Labs Lab 11/09/12 2103 11/10/12 0757  11/10/12 1151  GLUCAP 104* 108* 161*     Signed:  Conley Canal  [phone] [facility] 11/10/2012, 3:27 PM

## 2012-11-10 NOTE — Care Management Note (Addendum)
    Page 1 of 2   11/11/2012     2:34:37 PM   CARE MANAGEMENT NOTE 11/11/2012  Patient:  Marilyn Stone,Marilyn Stone   Account Number:  [id number]  Date Initiated:  11/10/2012  Documentation initiated by:  Letha Cape  Subjective/Objective Assessment:   dx aki  admit - lives with spouse.     Action/Plan:   pt eval- hhpt   Anticipated DC Date:  11/11/2012   Anticipated DC Plan:  HOME W HOME HEALTH SERVICES      DC Planning Services  CM consult      [facility] Choice  HOME HEALTH   Choice offered to / List presented to:  C-1 Patient        HH arranged  HH-1 RN  HH-6 SOCIAL WORKER  HH-2 PT      [facility] agency  [facility] HEALTH   Status of service:  Completed, signed off Medicare Important Message given?   (If response is "NO", the following Medicare IM given date fields will be blank) Date Medicare IM given:   Date Additional Medicare IM given:    Discharge Disposition:  HOME W HOME HEALTH SERVICES  Per UR Regulation:  Reviewed for med. necessity/level of care/duration of stay  If discussed at Long Length of Stay Meetings, dates discussed:    Comments:  11/11/12 14:29 Letha Cape RN, BSN [phone] patient for dc today, notified Eber Jones with [facility].  11/10/12 12:30 Letha Cape RN, BSN [phone] patient lives with spouse, patient [facility] and Social Worker and HHPT. Referral sent to them, they will be able to take patient. Soc will begin 24-48 hrs post discharge.  If patient dc today will change to observation status.

## 2012-11-10 NOTE — Discharge Summary (Signed)
Addendum  Patient seen and examined, chart and data base reviewed.  I agree with the above assessment and plan.  For full details please see Mrs. Algis Downs PA note.   Clint Lipps, MD [facility] Pager: [phone] 11/10/2012, 4:31 PM

## 2012-11-10 NOTE — ED Provider Notes (Signed)
Medical screening examination/treatment/procedure(s) were performed by non-physician practitioner and as supervising physician I was immediately available for consultation/collaboration.  Braylon Grenda R. Jailee Jaquez, MD 11/10/12 2334 

## 2012-11-11 DIAGNOSIS — I519 Heart disease, unspecified: Secondary | ICD-10-CM

## 2012-11-11 LAB — URINE CULTURE
Colony Count: NO GROWTH
Culture: NO GROWTH

## 2012-11-11 LAB — BASIC METABOLIC PANEL
BUN: 21 mg/dL (ref 6–23)
Chloride: 105 mEq/L (ref 96–112)
GFR calc Af Amer: 55 mL/min — ABNORMAL LOW (ref >90)
GFR calc non Af Amer: 47 mL/min — ABNORMAL LOW (ref >90)
Potassium: 3.8 mEq/L (ref 3.5–5.1)

## 2012-11-11 LAB — GLUCOSE, CAPILLARY
Glucose-Capillary: 94 mg/dL (ref 70–99)
Glucose-Capillary: 144 mg/dL — ABNORMAL HIGH (ref 70–99)

## 2012-11-11 MED ORDER — PERFLUTREN LIPID MICROSPHERE
1.0000 mL | INTRAVENOUS | Status: AC | PRN
  Administered 2012-11-11: 2 mL via INTRAVENOUS
  Filled 2012-11-11: qty 10

## 2012-11-11 NOTE — Progress Notes (Signed)
Echocardiogram 2D Echocardiogram with Definity has been performed.  Rendon Howell 11/11/2012, 12:02 PM

## 2012-11-11 NOTE — Progress Notes (Signed)
Subjective: No new complaints  Objective: Filed Vitals:   11/11/12 0546  BP: 130/67  Pulse: 54  Temp: 98.3 F (36.8 C)  Resp: 18  General: Alert and awake, oriented x3, not in any acute distress. HEENT: anicteric sclera, pupils reactive to light and accommodation, EOMI CVS: S1-S2 clear, no murmur rubs or gallops Chest: clear to auscultation bilaterally, no wheezing, rales or rhonchi Abdomen: soft nontender, nondistended, normal bowel sounds, no organomegaly Extremities: no cyanosis, clubbing or edema noted bilaterally Neuro: Cranial nerves II-XII intact, no focal neurological deficits  Assessment and plan: 1. Presyncope: Likely secondary to volume depletion synergize by the effect of antihypertensive medications. This is resolved. 2. Diarrhea: Unclear etiology, but resolved as well. Did not have any bowel movements in the hospital for 2 days. 3. Volume depletion/dehydration: Secondary to diarrhea, aggressive hydration with IV fluids. Patient is not orthostatic on discharge. 4. Acute kidney injury: Secondary to volume depletion/dehydration. This is resolved.  Clint Lipps Pager: [phone] 11/11/2012, 1:14 PM

## 2012-11-11 NOTE — Progress Notes (Signed)
Marilyn Stone to be D/C'd Home per MD order.  Discussed with the patient and all questions fully answered.    Medication List    STOP taking these medications       HYDROcodone-acetaminophen 5-325 MG per tablet  Commonly known as:  NORCO/VICODIN     valsartan-hydrochlorothiazide 320-25 MG per tablet  Commonly known as:  DIOVAN-HCT      TAKE these medications       ANTIVERT 25 MG tablet  Generic drug:  meclizine  Take 25 mg by mouth 3 (three) times daily.     CENTRUM SILVER PO  Take 1 tablet by mouth daily.     citalopram 40 MG tablet  Commonly known as:  CELEXA  Take 40 mg by mouth daily.     Cyanocobalamin 1000 MCG/ML Liqd  Take 1,000 mcg/mL by mouth every 30 (thirty) days.     donepezil 10 MG tablet  Commonly known as:  ARICEPT  Take 10 mg by mouth daily.     FISH OIL PO  Take 1 tablet by mouth daily.     furosemide 40 MG tablet  Commonly known as:  LASIX  Take 0.5 tablets (20 mg total) by mouth daily.     glimepiride 4 MG tablet  Commonly known as:  AMARYL  Take 4 mg by mouth 2 (two) times daily.     glucosamine-chondroitin 500-400 MG tablet  Take 1 tablet by mouth 4 (four) times daily.     ibuprofen 200 MG tablet  Commonly known as:  ADVIL,MOTRIN  Take 200 mg by mouth every 6 (six) hours as needed for pain.     lovastatin 40 MG tablet  Commonly known as:  MEVACOR  Take 40 mg by mouth 2 (two) times daily.     metFORMIN 500 MG (MOD) 24 hr tablet  Commonly known as:  GLUMETZA  Take 500 mg by mouth daily with breakfast.     metoprolol succinate 50 MG 24 hr tablet  Commonly known as:  TOPROL-XL  Take 1 tablet (50 mg total) by mouth 2 (two) times daily. Take with or immediately following a meal.     NAMENDA XR 28 MG Cp24  Generic drug:  Memantine HCl ER  Take 28 mg by mouth daily.     omeprazole 20 MG capsule  Commonly known as:  PRILOSEC  Take 20 mg by mouth daily.     polyethylene glycol packet  Commonly known as:  MIRALAX / GLYCOLAX  Take 17  g by mouth daily as needed (constipation).     sitaGLIPtin 100 MG tablet  Commonly known as:  JANUVIA  Take 50 mg by mouth daily.        VVS, Skin clean, dry and intact without evidence of skin break down, no evidence of skin tears noted. IV catheter discontinued intact. Site without signs and symptoms of complications. Dressing and pressure applied.  An After Visit Summary was printed and given to the patient and daughter in law. Follow up appointments , new prescriptions and medication administration times given. Pt and daughter in law given handouts on diarrhea and syncope. Patient escorted via WC, and D/C home via private auto.  Cindra Eves, RN 11/11/2012 4:26 PM

## 2014-11-12 DIAGNOSIS — J019 Acute sinusitis, unspecified: Secondary | ICD-10-CM | POA: Diagnosis not present

## 2014-11-12 DIAGNOSIS — J309 Allergic rhinitis, unspecified: Secondary | ICD-10-CM | POA: Diagnosis not present

## 2014-12-22 DIAGNOSIS — S72041A Displaced fracture of base of neck of right femur, initial encounter for closed fracture: Secondary | ICD-10-CM | POA: Diagnosis not present

## 2014-12-22 DIAGNOSIS — R9431 Abnormal electrocardiogram [ECG] [EKG]: Secondary | ICD-10-CM | POA: Diagnosis not present

## 2014-12-22 DIAGNOSIS — R938 Abnormal findings on diagnostic imaging of other specified body structures: Secondary | ICD-10-CM | POA: Diagnosis not present

## 2014-12-22 DIAGNOSIS — M25551 Pain in right hip: Secondary | ICD-10-CM | POA: Diagnosis not present

## 2014-12-22 DIAGNOSIS — G8918 Other acute postprocedural pain: Secondary | ICD-10-CM | POA: Diagnosis not present

## 2014-12-22 DIAGNOSIS — R079 Chest pain, unspecified: Secondary | ICD-10-CM | POA: Diagnosis not present

## 2014-12-22 DIAGNOSIS — F0390 Unspecified dementia without behavioral disturbance: Secondary | ICD-10-CM | POA: Diagnosis not present

## 2014-12-22 DIAGNOSIS — Z96643 Presence of artificial hip joint, bilateral: Secondary | ICD-10-CM | POA: Diagnosis not present

## 2014-12-22 DIAGNOSIS — R279 Unspecified lack of coordination: Secondary | ICD-10-CM | POA: Diagnosis not present

## 2014-12-22 DIAGNOSIS — Z7401 Bed confinement status: Secondary | ICD-10-CM | POA: Diagnosis not present

## 2014-12-22 DIAGNOSIS — S72051A Unspecified fracture of head of right femur, initial encounter for closed fracture: Secondary | ICD-10-CM | POA: Diagnosis not present

## 2014-12-22 DIAGNOSIS — R262 Difficulty in walking, not elsewhere classified: Secondary | ICD-10-CM | POA: Diagnosis not present

## 2014-12-22 DIAGNOSIS — I2510 Atherosclerotic heart disease of native coronary artery without angina pectoris: Secondary | ICD-10-CM | POA: Diagnosis not present

## 2014-12-22 DIAGNOSIS — Z794 Long term (current) use of insulin: Secondary | ICD-10-CM | POA: Diagnosis not present

## 2014-12-22 DIAGNOSIS — R52 Pain, unspecified: Secondary | ICD-10-CM | POA: Diagnosis not present

## 2014-12-22 DIAGNOSIS — E1142 Type 2 diabetes mellitus with diabetic polyneuropathy: Secondary | ICD-10-CM | POA: Diagnosis not present

## 2014-12-22 DIAGNOSIS — W19XXXD Unspecified fall, subsequent encounter: Secondary | ICD-10-CM | POA: Diagnosis not present

## 2014-12-22 DIAGNOSIS — Z471 Aftercare following joint replacement surgery: Secondary | ICD-10-CM | POA: Diagnosis not present

## 2014-12-22 DIAGNOSIS — S72001D Fracture of unspecified part of neck of right femur, subsequent encounter for closed fracture with routine healing: Secondary | ICD-10-CM | POA: Diagnosis not present

## 2014-12-22 DIAGNOSIS — R001 Bradycardia, unspecified: Secondary | ICD-10-CM | POA: Diagnosis not present

## 2014-12-22 DIAGNOSIS — S72001A Fracture of unspecified part of neck of right femur, initial encounter for closed fracture: Secondary | ICD-10-CM | POA: Diagnosis not present

## 2014-12-22 DIAGNOSIS — M810 Age-related osteoporosis without current pathological fracture: Secondary | ICD-10-CM | POA: Diagnosis not present

## 2014-12-22 DIAGNOSIS — E860 Dehydration: Secondary | ICD-10-CM | POA: Diagnosis not present

## 2014-12-22 DIAGNOSIS — I10 Essential (primary) hypertension: Secondary | ICD-10-CM | POA: Diagnosis not present

## 2014-12-25 DIAGNOSIS — Z7401 Bed confinement status: Secondary | ICD-10-CM | POA: Diagnosis not present

## 2014-12-25 DIAGNOSIS — M810 Age-related osteoporosis without current pathological fracture: Secondary | ICD-10-CM | POA: Diagnosis not present

## 2014-12-25 DIAGNOSIS — G8918 Other acute postprocedural pain: Secondary | ICD-10-CM | POA: Diagnosis not present

## 2014-12-25 DIAGNOSIS — Z794 Long term (current) use of insulin: Secondary | ICD-10-CM | POA: Diagnosis not present

## 2014-12-25 DIAGNOSIS — R001 Bradycardia, unspecified: Secondary | ICD-10-CM | POA: Diagnosis not present

## 2014-12-25 DIAGNOSIS — W19XXXD Unspecified fall, subsequent encounter: Secondary | ICD-10-CM | POA: Diagnosis not present

## 2014-12-25 DIAGNOSIS — S72001A Fracture of unspecified part of neck of right femur, initial encounter for closed fracture: Secondary | ICD-10-CM | POA: Diagnosis not present

## 2014-12-25 DIAGNOSIS — I2510 Atherosclerotic heart disease of native coronary artery without angina pectoris: Secondary | ICD-10-CM | POA: Diagnosis not present

## 2014-12-25 DIAGNOSIS — R938 Abnormal findings on diagnostic imaging of other specified body structures: Secondary | ICD-10-CM | POA: Diagnosis not present

## 2014-12-25 DIAGNOSIS — R279 Unspecified lack of coordination: Secondary | ICD-10-CM | POA: Diagnosis not present

## 2014-12-25 DIAGNOSIS — E1142 Type 2 diabetes mellitus with diabetic polyneuropathy: Secondary | ICD-10-CM | POA: Diagnosis not present

## 2014-12-25 DIAGNOSIS — F0390 Unspecified dementia without behavioral disturbance: Secondary | ICD-10-CM | POA: Diagnosis not present

## 2014-12-25 DIAGNOSIS — R9431 Abnormal electrocardiogram [ECG] [EKG]: Secondary | ICD-10-CM | POA: Diagnosis not present

## 2014-12-25 DIAGNOSIS — D649 Anemia, unspecified: Secondary | ICD-10-CM | POA: Diagnosis not present

## 2014-12-25 DIAGNOSIS — R262 Difficulty in walking, not elsewhere classified: Secondary | ICD-10-CM | POA: Diagnosis not present

## 2014-12-25 DIAGNOSIS — E860 Dehydration: Secondary | ICD-10-CM | POA: Diagnosis not present

## 2014-12-25 DIAGNOSIS — S72001D Fracture of unspecified part of neck of right femur, subsequent encounter for closed fracture with routine healing: Secondary | ICD-10-CM | POA: Diagnosis not present

## 2014-12-25 DIAGNOSIS — I10 Essential (primary) hypertension: Secondary | ICD-10-CM | POA: Diagnosis not present

## 2014-12-27 DIAGNOSIS — D649 Anemia, unspecified: Secondary | ICD-10-CM | POA: Diagnosis not present

## 2014-12-27 DIAGNOSIS — G8918 Other acute postprocedural pain: Secondary | ICD-10-CM | POA: Diagnosis not present

## 2014-12-27 DIAGNOSIS — R262 Difficulty in walking, not elsewhere classified: Secondary | ICD-10-CM | POA: Diagnosis not present

## 2014-12-27 DIAGNOSIS — S72001D Fracture of unspecified part of neck of right femur, subsequent encounter for closed fracture with routine healing: Secondary | ICD-10-CM | POA: Diagnosis not present

## 2015-01-09 DIAGNOSIS — G8918 Other acute postprocedural pain: Secondary | ICD-10-CM | POA: Diagnosis not present

## 2015-01-09 DIAGNOSIS — D649 Anemia, unspecified: Secondary | ICD-10-CM | POA: Diagnosis not present

## 2015-01-09 DIAGNOSIS — E1142 Type 2 diabetes mellitus with diabetic polyneuropathy: Secondary | ICD-10-CM | POA: Diagnosis not present

## 2015-01-09 DIAGNOSIS — I10 Essential (primary) hypertension: Secondary | ICD-10-CM | POA: Diagnosis not present

## 2015-01-09 DIAGNOSIS — R262 Difficulty in walking, not elsewhere classified: Secondary | ICD-10-CM | POA: Diagnosis not present

## 2015-01-09 DIAGNOSIS — R5381 Other malaise: Secondary | ICD-10-CM | POA: Diagnosis not present

## 2015-01-09 DIAGNOSIS — W19XXXD Unspecified fall, subsequent encounter: Secondary | ICD-10-CM | POA: Diagnosis not present

## 2015-01-09 DIAGNOSIS — F0390 Unspecified dementia without behavioral disturbance: Secondary | ICD-10-CM | POA: Diagnosis not present

## 2015-01-09 DIAGNOSIS — S72001D Fracture of unspecified part of neck of right femur, subsequent encounter for closed fracture with routine healing: Secondary | ICD-10-CM | POA: Diagnosis not present

## 2015-01-11 DIAGNOSIS — E1142 Type 2 diabetes mellitus with diabetic polyneuropathy: Secondary | ICD-10-CM | POA: Diagnosis not present

## 2015-01-11 DIAGNOSIS — F0390 Unspecified dementia without behavioral disturbance: Secondary | ICD-10-CM | POA: Diagnosis not present

## 2015-01-11 DIAGNOSIS — S72001D Fracture of unspecified part of neck of right femur, subsequent encounter for closed fracture with routine healing: Secondary | ICD-10-CM | POA: Diagnosis not present

## 2015-01-11 DIAGNOSIS — W19XXXD Unspecified fall, subsequent encounter: Secondary | ICD-10-CM | POA: Diagnosis not present

## 2015-01-11 DIAGNOSIS — D649 Anemia, unspecified: Secondary | ICD-10-CM | POA: Diagnosis not present

## 2015-01-11 DIAGNOSIS — I10 Essential (primary) hypertension: Secondary | ICD-10-CM | POA: Diagnosis not present

## 2015-01-11 DIAGNOSIS — G8918 Other acute postprocedural pain: Secondary | ICD-10-CM | POA: Diagnosis not present

## 2015-01-11 DIAGNOSIS — R5381 Other malaise: Secondary | ICD-10-CM | POA: Diagnosis not present

## 2015-01-11 DIAGNOSIS — R262 Difficulty in walking, not elsewhere classified: Secondary | ICD-10-CM | POA: Diagnosis not present

## 2015-01-14 DIAGNOSIS — R262 Difficulty in walking, not elsewhere classified: Secondary | ICD-10-CM | POA: Diagnosis not present

## 2015-01-14 DIAGNOSIS — G8918 Other acute postprocedural pain: Secondary | ICD-10-CM | POA: Diagnosis not present

## 2015-01-14 DIAGNOSIS — F0390 Unspecified dementia without behavioral disturbance: Secondary | ICD-10-CM | POA: Diagnosis not present

## 2015-01-14 DIAGNOSIS — I10 Essential (primary) hypertension: Secondary | ICD-10-CM | POA: Diagnosis not present

## 2015-01-14 DIAGNOSIS — W19XXXD Unspecified fall, subsequent encounter: Secondary | ICD-10-CM | POA: Diagnosis not present

## 2015-01-14 DIAGNOSIS — S72001D Fracture of unspecified part of neck of right femur, subsequent encounter for closed fracture with routine healing: Secondary | ICD-10-CM | POA: Diagnosis not present

## 2015-01-14 DIAGNOSIS — D649 Anemia, unspecified: Secondary | ICD-10-CM | POA: Diagnosis not present

## 2015-01-14 DIAGNOSIS — R5381 Other malaise: Secondary | ICD-10-CM | POA: Diagnosis not present

## 2015-01-14 DIAGNOSIS — E1142 Type 2 diabetes mellitus with diabetic polyneuropathy: Secondary | ICD-10-CM | POA: Diagnosis not present

## 2015-01-15 DIAGNOSIS — R262 Difficulty in walking, not elsewhere classified: Secondary | ICD-10-CM | POA: Diagnosis not present

## 2015-01-15 DIAGNOSIS — D649 Anemia, unspecified: Secondary | ICD-10-CM | POA: Diagnosis not present

## 2015-01-15 DIAGNOSIS — S72001D Fracture of unspecified part of neck of right femur, subsequent encounter for closed fracture with routine healing: Secondary | ICD-10-CM | POA: Diagnosis not present

## 2015-01-15 DIAGNOSIS — I10 Essential (primary) hypertension: Secondary | ICD-10-CM | POA: Diagnosis not present

## 2015-01-15 DIAGNOSIS — R5381 Other malaise: Secondary | ICD-10-CM | POA: Diagnosis not present

## 2015-01-15 DIAGNOSIS — E1142 Type 2 diabetes mellitus with diabetic polyneuropathy: Secondary | ICD-10-CM | POA: Diagnosis not present

## 2015-01-15 DIAGNOSIS — G8918 Other acute postprocedural pain: Secondary | ICD-10-CM | POA: Diagnosis not present

## 2015-01-15 DIAGNOSIS — F0390 Unspecified dementia without behavioral disturbance: Secondary | ICD-10-CM | POA: Diagnosis not present

## 2015-01-15 DIAGNOSIS — W19XXXD Unspecified fall, subsequent encounter: Secondary | ICD-10-CM | POA: Diagnosis not present

## 2015-01-16 DIAGNOSIS — E1142 Type 2 diabetes mellitus with diabetic polyneuropathy: Secondary | ICD-10-CM | POA: Diagnosis not present

## 2015-01-16 DIAGNOSIS — D649 Anemia, unspecified: Secondary | ICD-10-CM | POA: Diagnosis not present

## 2015-01-16 DIAGNOSIS — F0390 Unspecified dementia without behavioral disturbance: Secondary | ICD-10-CM | POA: Diagnosis not present

## 2015-01-16 DIAGNOSIS — S72001D Fracture of unspecified part of neck of right femur, subsequent encounter for closed fracture with routine healing: Secondary | ICD-10-CM | POA: Diagnosis not present

## 2015-01-16 DIAGNOSIS — R5381 Other malaise: Secondary | ICD-10-CM | POA: Diagnosis not present

## 2015-01-16 DIAGNOSIS — G8918 Other acute postprocedural pain: Secondary | ICD-10-CM | POA: Diagnosis not present

## 2015-01-16 DIAGNOSIS — I10 Essential (primary) hypertension: Secondary | ICD-10-CM | POA: Diagnosis not present

## 2015-01-16 DIAGNOSIS — R262 Difficulty in walking, not elsewhere classified: Secondary | ICD-10-CM | POA: Diagnosis not present

## 2015-01-16 DIAGNOSIS — W19XXXD Unspecified fall, subsequent encounter: Secondary | ICD-10-CM | POA: Diagnosis not present

## 2015-01-17 DIAGNOSIS — F0390 Unspecified dementia without behavioral disturbance: Secondary | ICD-10-CM | POA: Diagnosis not present

## 2015-01-17 DIAGNOSIS — E1142 Type 2 diabetes mellitus with diabetic polyneuropathy: Secondary | ICD-10-CM | POA: Diagnosis not present

## 2015-01-17 DIAGNOSIS — S72001D Fracture of unspecified part of neck of right femur, subsequent encounter for closed fracture with routine healing: Secondary | ICD-10-CM | POA: Diagnosis not present

## 2015-01-17 DIAGNOSIS — R5381 Other malaise: Secondary | ICD-10-CM | POA: Diagnosis not present

## 2015-01-17 DIAGNOSIS — G8918 Other acute postprocedural pain: Secondary | ICD-10-CM | POA: Diagnosis not present

## 2015-01-17 DIAGNOSIS — W19XXXD Unspecified fall, subsequent encounter: Secondary | ICD-10-CM | POA: Diagnosis not present

## 2015-01-17 DIAGNOSIS — I10 Essential (primary) hypertension: Secondary | ICD-10-CM | POA: Diagnosis not present

## 2015-01-17 DIAGNOSIS — D649 Anemia, unspecified: Secondary | ICD-10-CM | POA: Diagnosis not present

## 2015-01-17 DIAGNOSIS — R262 Difficulty in walking, not elsewhere classified: Secondary | ICD-10-CM | POA: Diagnosis not present

## 2015-01-18 DIAGNOSIS — I10 Essential (primary) hypertension: Secondary | ICD-10-CM | POA: Diagnosis not present

## 2015-01-18 DIAGNOSIS — R262 Difficulty in walking, not elsewhere classified: Secondary | ICD-10-CM | POA: Diagnosis not present

## 2015-01-18 DIAGNOSIS — D649 Anemia, unspecified: Secondary | ICD-10-CM | POA: Diagnosis not present

## 2015-01-18 DIAGNOSIS — G8918 Other acute postprocedural pain: Secondary | ICD-10-CM | POA: Diagnosis not present

## 2015-01-18 DIAGNOSIS — R5381 Other malaise: Secondary | ICD-10-CM | POA: Diagnosis not present

## 2015-01-18 DIAGNOSIS — F0390 Unspecified dementia without behavioral disturbance: Secondary | ICD-10-CM | POA: Diagnosis not present

## 2015-01-18 DIAGNOSIS — W19XXXD Unspecified fall, subsequent encounter: Secondary | ICD-10-CM | POA: Diagnosis not present

## 2015-01-18 DIAGNOSIS — E1142 Type 2 diabetes mellitus with diabetic polyneuropathy: Secondary | ICD-10-CM | POA: Diagnosis not present

## 2015-01-18 DIAGNOSIS — S72001D Fracture of unspecified part of neck of right femur, subsequent encounter for closed fracture with routine healing: Secondary | ICD-10-CM | POA: Diagnosis not present

## 2015-01-22 DIAGNOSIS — S72001D Fracture of unspecified part of neck of right femur, subsequent encounter for closed fracture with routine healing: Secondary | ICD-10-CM | POA: Diagnosis not present

## 2015-01-22 DIAGNOSIS — E1142 Type 2 diabetes mellitus with diabetic polyneuropathy: Secondary | ICD-10-CM | POA: Diagnosis not present

## 2015-01-22 DIAGNOSIS — I10 Essential (primary) hypertension: Secondary | ICD-10-CM | POA: Diagnosis not present

## 2015-01-22 DIAGNOSIS — D649 Anemia, unspecified: Secondary | ICD-10-CM | POA: Diagnosis not present

## 2015-01-22 DIAGNOSIS — F0390 Unspecified dementia without behavioral disturbance: Secondary | ICD-10-CM | POA: Diagnosis not present

## 2015-01-22 DIAGNOSIS — R262 Difficulty in walking, not elsewhere classified: Secondary | ICD-10-CM | POA: Diagnosis not present

## 2015-01-22 DIAGNOSIS — R5381 Other malaise: Secondary | ICD-10-CM | POA: Diagnosis not present

## 2015-01-22 DIAGNOSIS — W19XXXD Unspecified fall, subsequent encounter: Secondary | ICD-10-CM | POA: Diagnosis not present

## 2015-01-22 DIAGNOSIS — G8918 Other acute postprocedural pain: Secondary | ICD-10-CM | POA: Diagnosis not present

## 2015-01-23 DIAGNOSIS — G8918 Other acute postprocedural pain: Secondary | ICD-10-CM | POA: Diagnosis not present

## 2015-01-23 DIAGNOSIS — I10 Essential (primary) hypertension: Secondary | ICD-10-CM | POA: Diagnosis not present

## 2015-01-23 DIAGNOSIS — R262 Difficulty in walking, not elsewhere classified: Secondary | ICD-10-CM | POA: Diagnosis not present

## 2015-01-23 DIAGNOSIS — W19XXXD Unspecified fall, subsequent encounter: Secondary | ICD-10-CM | POA: Diagnosis not present

## 2015-01-23 DIAGNOSIS — E1142 Type 2 diabetes mellitus with diabetic polyneuropathy: Secondary | ICD-10-CM | POA: Diagnosis not present

## 2015-01-23 DIAGNOSIS — D649 Anemia, unspecified: Secondary | ICD-10-CM | POA: Diagnosis not present

## 2015-01-23 DIAGNOSIS — F0390 Unspecified dementia without behavioral disturbance: Secondary | ICD-10-CM | POA: Diagnosis not present

## 2015-01-23 DIAGNOSIS — S72001D Fracture of unspecified part of neck of right femur, subsequent encounter for closed fracture with routine healing: Secondary | ICD-10-CM | POA: Diagnosis not present

## 2015-01-23 DIAGNOSIS — R5381 Other malaise: Secondary | ICD-10-CM | POA: Diagnosis not present

## 2015-01-24 DIAGNOSIS — F0390 Unspecified dementia without behavioral disturbance: Secondary | ICD-10-CM | POA: Diagnosis not present

## 2015-01-24 DIAGNOSIS — E1142 Type 2 diabetes mellitus with diabetic polyneuropathy: Secondary | ICD-10-CM | POA: Diagnosis not present

## 2015-01-24 DIAGNOSIS — D649 Anemia, unspecified: Secondary | ICD-10-CM | POA: Diagnosis not present

## 2015-01-24 DIAGNOSIS — G8918 Other acute postprocedural pain: Secondary | ICD-10-CM | POA: Diagnosis not present

## 2015-01-24 DIAGNOSIS — R5381 Other malaise: Secondary | ICD-10-CM | POA: Diagnosis not present

## 2015-01-24 DIAGNOSIS — R262 Difficulty in walking, not elsewhere classified: Secondary | ICD-10-CM | POA: Diagnosis not present

## 2015-01-24 DIAGNOSIS — I10 Essential (primary) hypertension: Secondary | ICD-10-CM | POA: Diagnosis not present

## 2015-01-24 DIAGNOSIS — S72001D Fracture of unspecified part of neck of right femur, subsequent encounter for closed fracture with routine healing: Secondary | ICD-10-CM | POA: Diagnosis not present

## 2015-01-24 DIAGNOSIS — W19XXXD Unspecified fall, subsequent encounter: Secondary | ICD-10-CM | POA: Diagnosis not present

## 2015-01-25 DIAGNOSIS — S72001D Fracture of unspecified part of neck of right femur, subsequent encounter for closed fracture with routine healing: Secondary | ICD-10-CM | POA: Diagnosis not present

## 2015-01-25 DIAGNOSIS — R262 Difficulty in walking, not elsewhere classified: Secondary | ICD-10-CM | POA: Diagnosis not present

## 2015-01-25 DIAGNOSIS — E1142 Type 2 diabetes mellitus with diabetic polyneuropathy: Secondary | ICD-10-CM | POA: Diagnosis not present

## 2015-01-25 DIAGNOSIS — D649 Anemia, unspecified: Secondary | ICD-10-CM | POA: Diagnosis not present

## 2015-01-25 DIAGNOSIS — W19XXXD Unspecified fall, subsequent encounter: Secondary | ICD-10-CM | POA: Diagnosis not present

## 2015-01-25 DIAGNOSIS — R5381 Other malaise: Secondary | ICD-10-CM | POA: Diagnosis not present

## 2015-01-25 DIAGNOSIS — F0390 Unspecified dementia without behavioral disturbance: Secondary | ICD-10-CM | POA: Diagnosis not present

## 2015-01-25 DIAGNOSIS — G8918 Other acute postprocedural pain: Secondary | ICD-10-CM | POA: Diagnosis not present

## 2015-01-25 DIAGNOSIS — I10 Essential (primary) hypertension: Secondary | ICD-10-CM | POA: Diagnosis not present

## 2015-01-29 DIAGNOSIS — G8918 Other acute postprocedural pain: Secondary | ICD-10-CM | POA: Diagnosis not present

## 2015-01-29 DIAGNOSIS — I10 Essential (primary) hypertension: Secondary | ICD-10-CM | POA: Diagnosis not present

## 2015-01-29 DIAGNOSIS — F0390 Unspecified dementia without behavioral disturbance: Secondary | ICD-10-CM | POA: Diagnosis not present

## 2015-01-29 DIAGNOSIS — W19XXXD Unspecified fall, subsequent encounter: Secondary | ICD-10-CM | POA: Diagnosis not present

## 2015-01-29 DIAGNOSIS — E1142 Type 2 diabetes mellitus with diabetic polyneuropathy: Secondary | ICD-10-CM | POA: Diagnosis not present

## 2015-01-29 DIAGNOSIS — D649 Anemia, unspecified: Secondary | ICD-10-CM | POA: Diagnosis not present

## 2015-01-29 DIAGNOSIS — S72001D Fracture of unspecified part of neck of right femur, subsequent encounter for closed fracture with routine healing: Secondary | ICD-10-CM | POA: Diagnosis not present

## 2015-01-29 DIAGNOSIS — R262 Difficulty in walking, not elsewhere classified: Secondary | ICD-10-CM | POA: Diagnosis not present

## 2015-01-29 DIAGNOSIS — R5381 Other malaise: Secondary | ICD-10-CM | POA: Diagnosis not present

## 2015-01-30 DIAGNOSIS — R262 Difficulty in walking, not elsewhere classified: Secondary | ICD-10-CM | POA: Diagnosis not present

## 2015-01-30 DIAGNOSIS — I10 Essential (primary) hypertension: Secondary | ICD-10-CM | POA: Diagnosis not present

## 2015-01-30 DIAGNOSIS — S72001D Fracture of unspecified part of neck of right femur, subsequent encounter for closed fracture with routine healing: Secondary | ICD-10-CM | POA: Diagnosis not present

## 2015-01-30 DIAGNOSIS — G8918 Other acute postprocedural pain: Secondary | ICD-10-CM | POA: Diagnosis not present

## 2015-01-30 DIAGNOSIS — E1142 Type 2 diabetes mellitus with diabetic polyneuropathy: Secondary | ICD-10-CM | POA: Diagnosis not present

## 2015-01-30 DIAGNOSIS — F0390 Unspecified dementia without behavioral disturbance: Secondary | ICD-10-CM | POA: Diagnosis not present

## 2015-01-30 DIAGNOSIS — D649 Anemia, unspecified: Secondary | ICD-10-CM | POA: Diagnosis not present

## 2015-01-30 DIAGNOSIS — R5381 Other malaise: Secondary | ICD-10-CM | POA: Diagnosis not present

## 2015-01-30 DIAGNOSIS — W19XXXD Unspecified fall, subsequent encounter: Secondary | ICD-10-CM | POA: Diagnosis not present

## 2015-02-05 DIAGNOSIS — G8918 Other acute postprocedural pain: Secondary | ICD-10-CM | POA: Diagnosis not present

## 2015-02-05 DIAGNOSIS — I10 Essential (primary) hypertension: Secondary | ICD-10-CM | POA: Diagnosis not present

## 2015-02-05 DIAGNOSIS — F0390 Unspecified dementia without behavioral disturbance: Secondary | ICD-10-CM | POA: Diagnosis not present

## 2015-02-05 DIAGNOSIS — D649 Anemia, unspecified: Secondary | ICD-10-CM | POA: Diagnosis not present

## 2015-02-05 DIAGNOSIS — S72001D Fracture of unspecified part of neck of right femur, subsequent encounter for closed fracture with routine healing: Secondary | ICD-10-CM | POA: Diagnosis not present

## 2015-02-05 DIAGNOSIS — R5381 Other malaise: Secondary | ICD-10-CM | POA: Diagnosis not present

## 2015-02-05 DIAGNOSIS — E1142 Type 2 diabetes mellitus with diabetic polyneuropathy: Secondary | ICD-10-CM | POA: Diagnosis not present

## 2015-02-05 DIAGNOSIS — R262 Difficulty in walking, not elsewhere classified: Secondary | ICD-10-CM | POA: Diagnosis not present

## 2015-02-05 DIAGNOSIS — W19XXXD Unspecified fall, subsequent encounter: Secondary | ICD-10-CM | POA: Diagnosis not present

## 2015-03-07 DIAGNOSIS — R5381 Other malaise: Secondary | ICD-10-CM | POA: Diagnosis not present

## 2015-03-07 DIAGNOSIS — I10 Essential (primary) hypertension: Secondary | ICD-10-CM | POA: Diagnosis not present

## 2015-03-07 DIAGNOSIS — S72001D Fracture of unspecified part of neck of right femur, subsequent encounter for closed fracture with routine healing: Secondary | ICD-10-CM | POA: Diagnosis not present

## 2015-03-07 DIAGNOSIS — W19XXXD Unspecified fall, subsequent encounter: Secondary | ICD-10-CM | POA: Diagnosis not present

## 2015-03-07 DIAGNOSIS — G8918 Other acute postprocedural pain: Secondary | ICD-10-CM | POA: Diagnosis not present

## 2015-03-07 DIAGNOSIS — E1142 Type 2 diabetes mellitus with diabetic polyneuropathy: Secondary | ICD-10-CM | POA: Diagnosis not present

## 2015-03-07 DIAGNOSIS — F0390 Unspecified dementia without behavioral disturbance: Secondary | ICD-10-CM | POA: Diagnosis not present

## 2015-03-07 DIAGNOSIS — R262 Difficulty in walking, not elsewhere classified: Secondary | ICD-10-CM | POA: Diagnosis not present

## 2015-03-07 DIAGNOSIS — D649 Anemia, unspecified: Secondary | ICD-10-CM | POA: Diagnosis not present

## 2015-04-29 IMAGING — CR DG CHEST 2V
2 series · 2 of 2 positions shown · non-contrast
Comparison: 08/29/2010

CLINICAL DATA: Near syncopal episode

CHEST - 2 VIEW

[w chest pa]
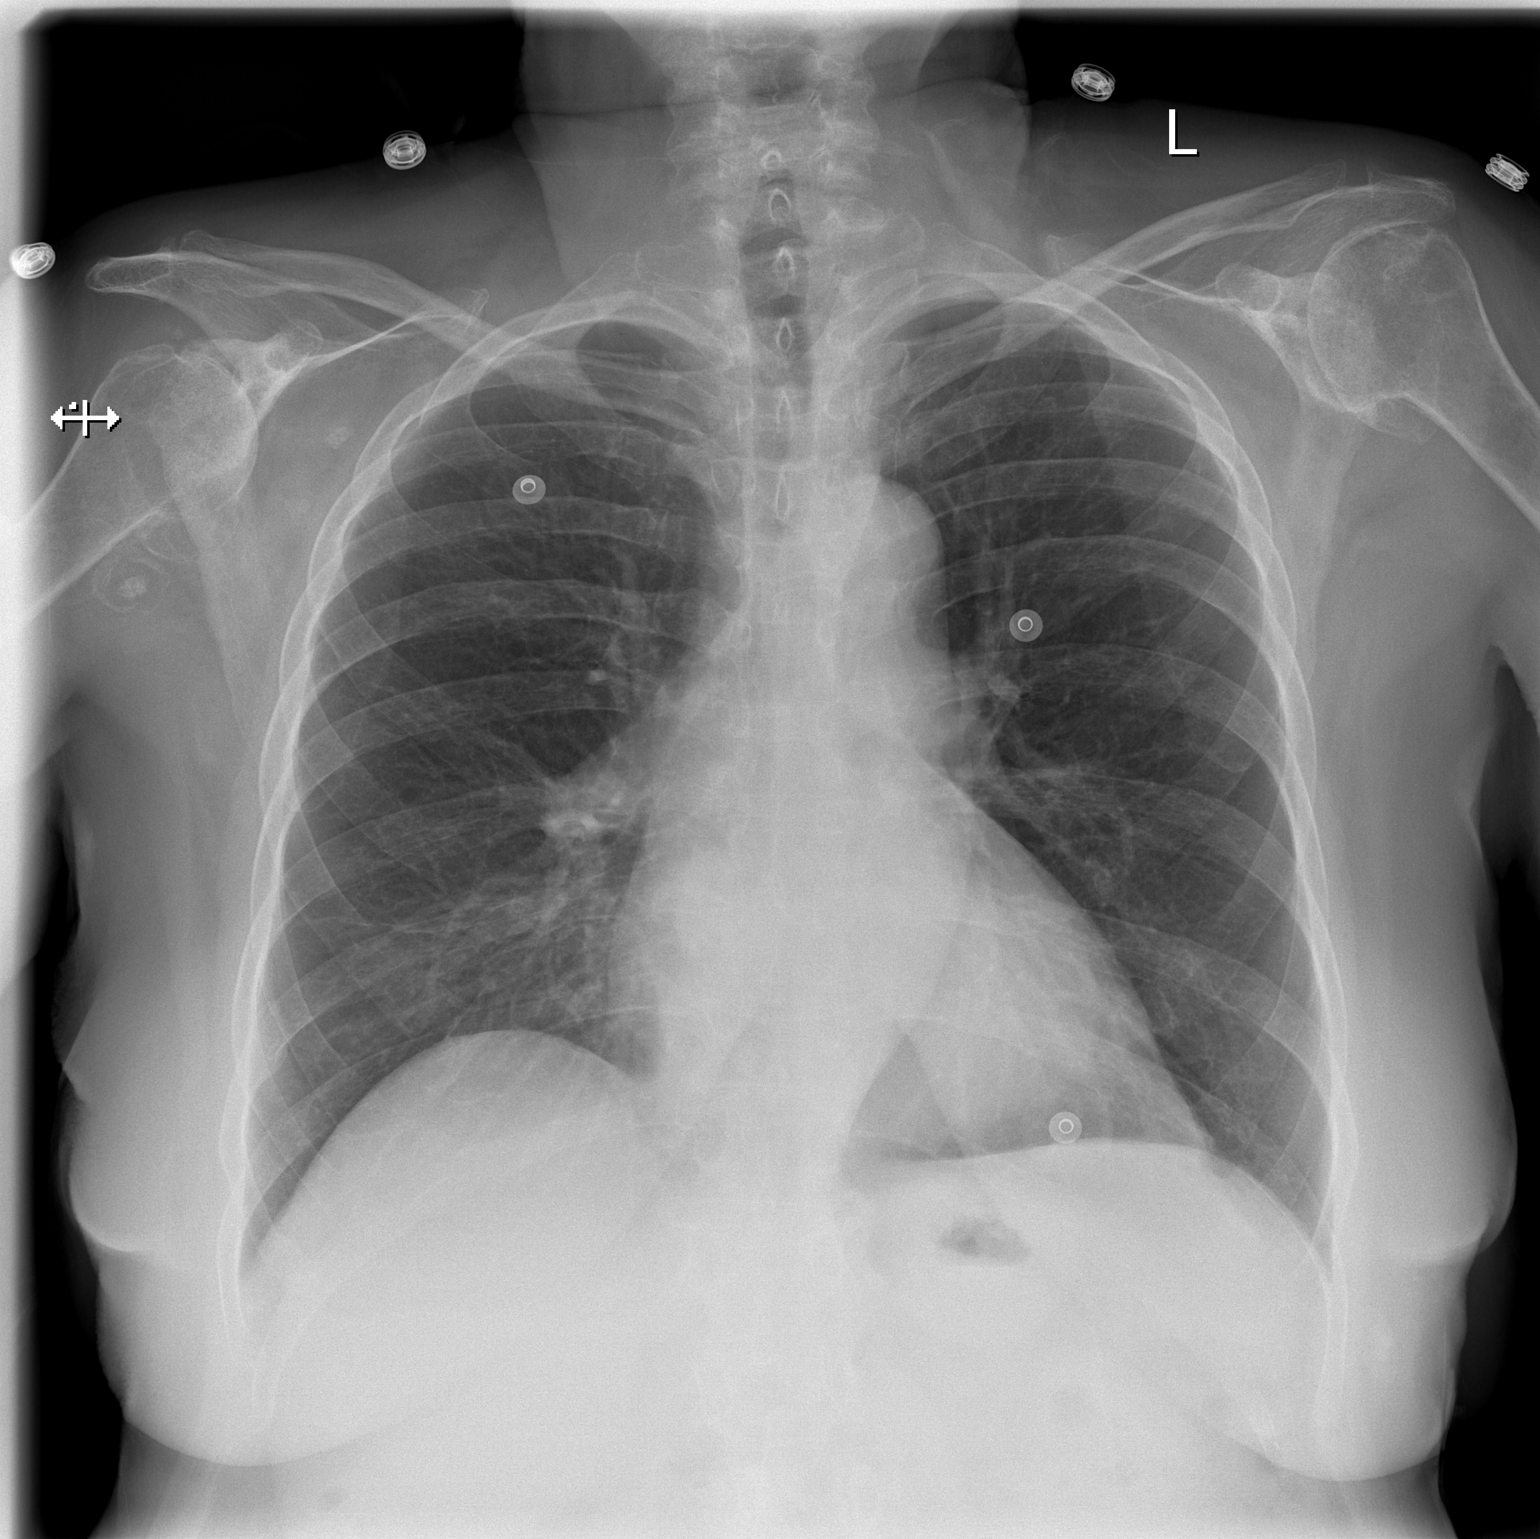

[w chest lat]
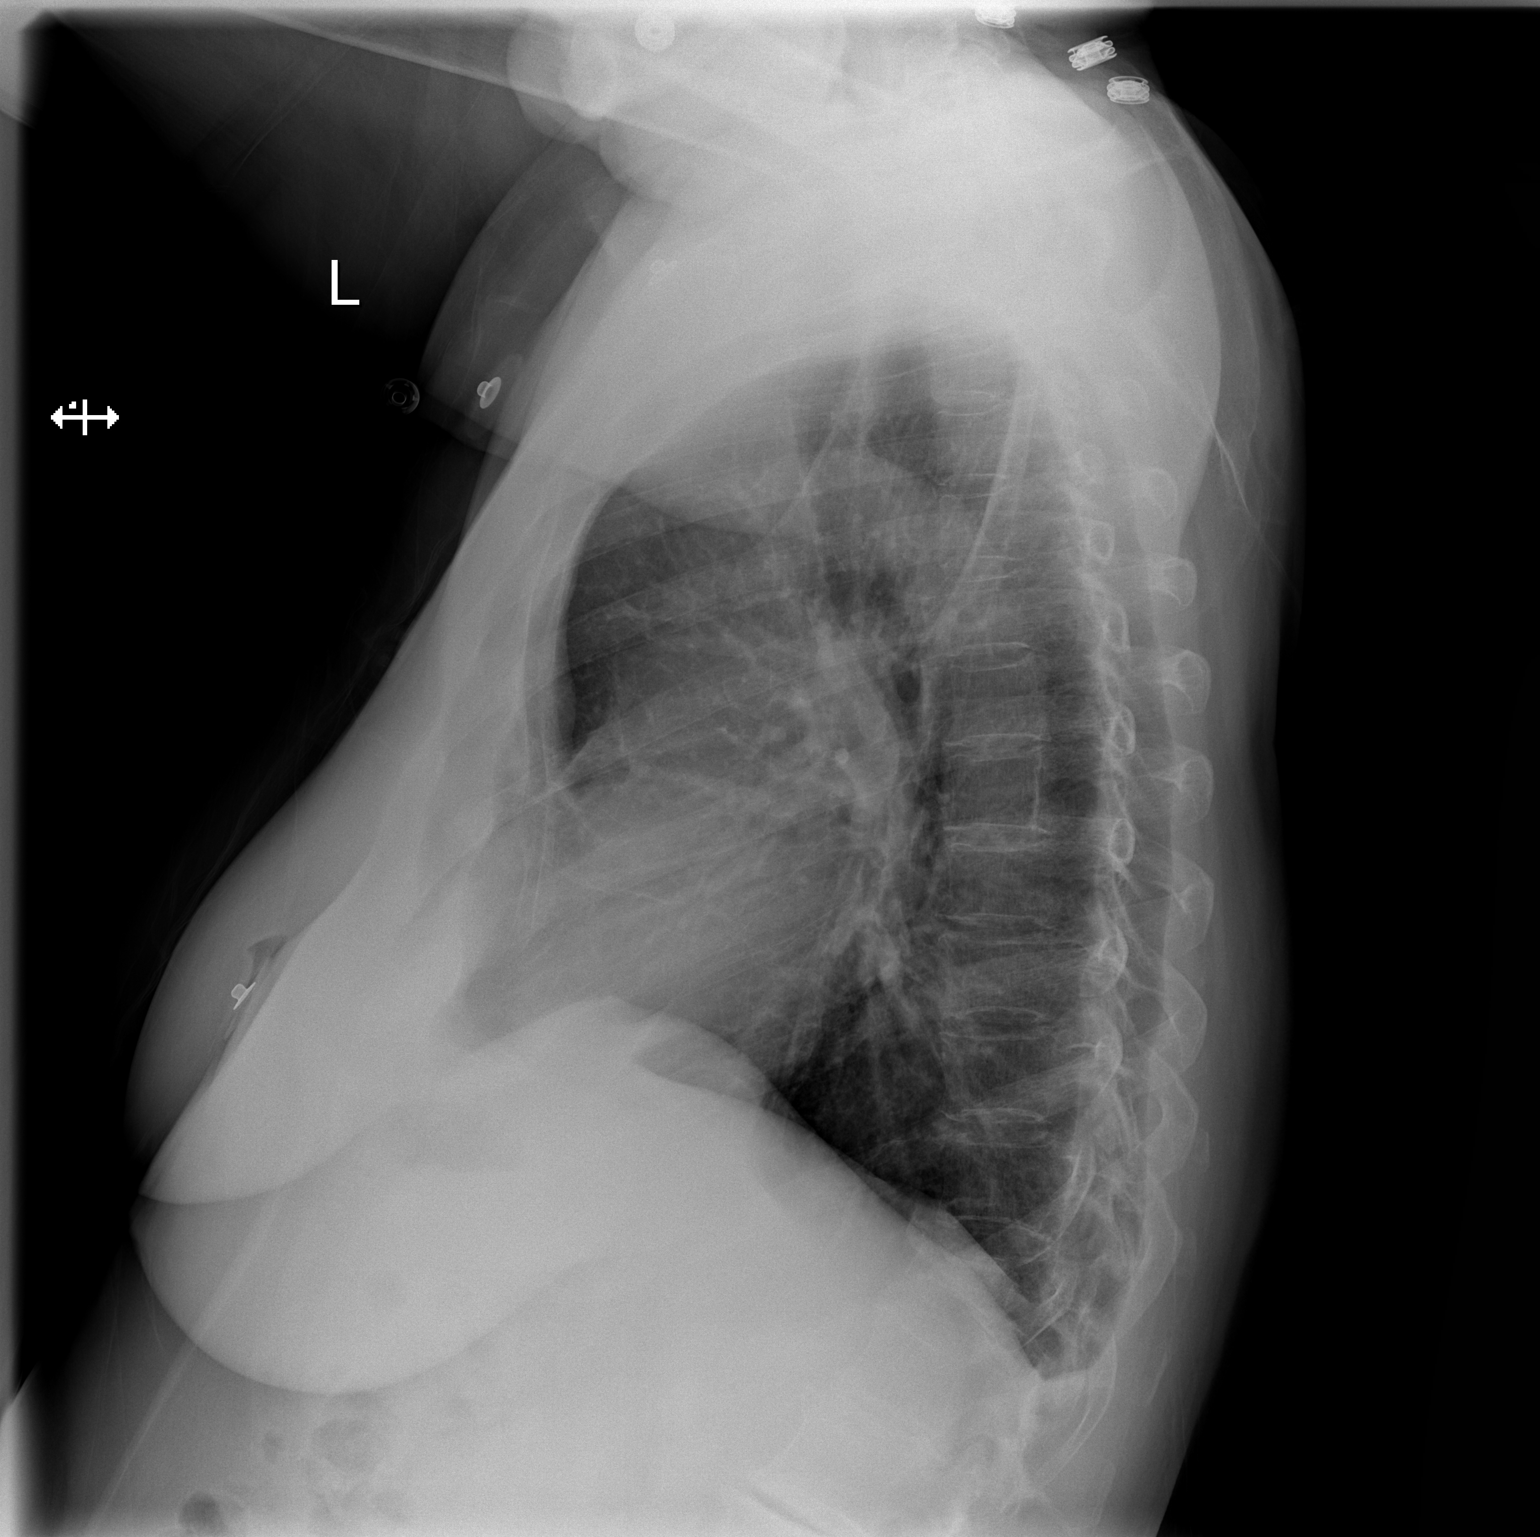

[2 of 2 positions shown; findings below may reference images not displayed]

FINDINGS: The heart and pulmonary vascularity are within normal
limits.  The lungs are clear bilaterally.  A chronic compression
deformity is noted in the lower thoracic spine.
IMPRESSION: No acute abnormality noted.

## 2019-06-23 DIAGNOSIS — S91109A Unspecified open wound of unspecified toe(s) without damage to nail, initial encounter: Secondary | ICD-10-CM | POA: Diagnosis not present

## 2019-06-29 ENCOUNTER — Other Ambulatory Visit: Payer: Self-pay

## 2019-06-29 ENCOUNTER — Ambulatory Visit: Payer: Medicare PPO | Admitting: Sports Medicine

## 2019-06-29 ENCOUNTER — Other Ambulatory Visit: Payer: Self-pay | Admitting: Sports Medicine

## 2019-06-29 ENCOUNTER — Ambulatory Visit (INDEPENDENT_AMBULATORY_CARE_PROVIDER_SITE_OTHER): Payer: Medicare PPO

## 2019-06-29 ENCOUNTER — Encounter: Payer: Self-pay | Admitting: Sports Medicine

## 2019-06-29 DIAGNOSIS — E119 Type 2 diabetes mellitus without complications: Secondary | ICD-10-CM | POA: Diagnosis not present

## 2019-06-29 DIAGNOSIS — M79675 Pain in left toe(s): Secondary | ICD-10-CM | POA: Diagnosis not present

## 2019-06-29 DIAGNOSIS — L03032 Cellulitis of left toe: Secondary | ICD-10-CM

## 2019-06-29 DIAGNOSIS — B351 Tinea unguium: Secondary | ICD-10-CM

## 2019-06-29 DIAGNOSIS — I739 Peripheral vascular disease, unspecified: Secondary | ICD-10-CM | POA: Diagnosis not present

## 2019-06-29 DIAGNOSIS — L97521 Non-pressure chronic ulcer of other part of left foot limited to breakdown of skin: Secondary | ICD-10-CM | POA: Diagnosis not present

## 2019-06-29 DIAGNOSIS — M79674 Pain in right toe(s): Secondary | ICD-10-CM

## 2019-06-29 DIAGNOSIS — L02612 Cutaneous abscess of left foot: Secondary | ICD-10-CM | POA: Diagnosis not present

## 2019-06-29 NOTE — Progress Notes (Signed)
Subjective: Marilyn Stone is a 86 y.o. female patient seen in office for evaluation of ulceration of the left first and second toes. Patient has a history of diabetes and a blood glucose level not recorded patient does not remember.  Patient is changing the dressing using what daughter-in-law thinks is Bactroban at home and has been using some Neosporin and cleaning with peroxide and soaking with Epson salt and also reports that her PCP has given them Keflex as well.  Daughter in law thinks that they are looking better but this started after 2 weeks ago when her mother-in-law bumped or hit her toes against the bed reports that she does this a lot and always hurts her toes. Denies nausea/fever/vomiting/chills/night sweats/shortness of breath/pain. Patient has no other pedal complaints at this time.  Review of Systems  All other systems reviewed and are negative.  Patient Active Problem List   Diagnosis Date Noted  . AKI (acute kidney injury) (HCC) 11/09/2012  . Diarrhea 11/09/2012  . Near syncope 11/09/2012  . DM (diabetes mellitus) (HCC) 11/09/2012  . Transient hypotension 11/09/2012   Current Outpatient Medications on File Prior to Visit  Medication Sig Dispense Refill  . citalopram (CELEXA) 40 MG tablet Take 40 mg by mouth daily.    . Cyanocobalamin 1000 MCG/ML LIQD Take 1,000 mcg/mL by mouth every 30 (thirty) days.    Marland Kitchen donepezil (ARICEPT) 10 MG tablet Take 10 mg by mouth daily.    . furosemide (LASIX) 40 MG tablet Take 0.5 tablets (20 mg total) by mouth daily. 30 tablet 1  . glimepiride (AMARYL) 4 MG tablet Take 4 mg by mouth 2 (two) times daily.    Marland Kitchen glucosamine-chondroitin 500-400 MG tablet Take 1 tablet by mouth 4 (four) times daily.    Marland Kitchen ibuprofen (ADVIL,MOTRIN) 200 MG tablet Take 200 mg by mouth every 6 (six) hours as needed for pain.    Marland Kitchen lovastatin (MEVACOR) 40 MG tablet Take 40 mg by mouth 2 (two) times daily.    . meclizine (ANTIVERT) 25 MG tablet Take 25 mg by mouth 3 (three)  times daily.    . Memantine HCl ER (NAMENDA XR) 28 MG CP24 Take 28 mg by mouth daily.    . metFORMIN (GLUMETZA) 500 MG (MOD) 24 hr tablet Take 500 mg by mouth daily with breakfast.    . metoprolol succinate (TOPROL-XL) 50 MG 24 hr tablet Take 1 tablet (50 mg total) by mouth 2 (two) times daily. Take with or immediately following a meal. 60 tablet 2  . Multiple Vitamins-Minerals (CENTRUM SILVER PO) Take 1 tablet by mouth daily.    . Omega-3 Fatty Acids (FISH OIL PO) Take 1 tablet by mouth daily.    Marland Kitchen omeprazole (PRILOSEC) 20 MG capsule Take 20 mg by mouth daily.    . polyethylene glycol (MIRALAX / GLYCOLAX) packet Take 17 g by mouth daily as needed (constipation).    . sitaGLIPtin (JANUVIA) 100 MG tablet Take 50 mg by mouth daily.     No current facility-administered medications on file prior to visit.   No Known Allergies  No results found for this or any previous visit (from the past 2160 hour(s)).  Objective: There were no vitals filed for this visit.  General: Patient is awake, alert, oriented x 3 and in no acute distress.  Dermatology: Skin is warm and dry bilateral with a partial thickness ulceration present left 2nd toe. Ulceration measures 0.3 x 0.2 x 0.1 cm at the left second toe with a granular base  and dry peeling skin along the margins.  There is no malodor, no active drainage, faint focal erythema, mild edema. No other acute signs of infection.  Previous ulceration healed at the left first toe.  Nails are thickened and elongated x10.   Vascular: Dorsalis Pedis pulse = 1/4 Bilateral,  Posterior Tibial pulse = 0/4 Bilateral,  Capillary Fill Time < 5 seconds  Neurologic: Gross sensation present via light touch.   Musculosketal: Minimal pain to palpation to ulcerated area left foot.  Significant bunion and hammertoe deformity.  No other acute findings.  Xrays, left foot:No bony destruction suggestive of osteomyelitis. No gas in soft tissues.  Chronic bunion and toe  deformity.  No results for input(s): GRAMSTAIN, LABORGA in the last 8760 hours.  Assessment and Plan:  Problem List Items Addressed This Visit    None    Visit Diagnoses    Toe ulcer, left, limited to breakdown of skin ([place])    -  Primary   Cellulitis and abscess of toe of left foot       Diabetes mellitus without complication (HCC)       PVD (peripheral vascular disease) ([place])       Pain due to onychomycosis of toenails of both feet           -Examined patient and discussed the progression of the wound and treatment alternatives. -Xrays reviewed - Excisionally dedbrided ulceration at left second toe to healthy bleeding borders removing nonviable tissue using a sterile chisel blade. Wound measures post debridement as above wound was debrided to the level of the dermis with viable wound base exposed to promote healing. Hemostasis was achieved with manuel pressure. Patient tolerated procedure well without any discomfort or anesthesia necessary for this wound debridement.  -Applied antibiotic cream and dry sterile dressing and instructed patient to continue with daily dressings at home consisting of the same with help from daughter-in-law best to do dressings at bedtime and to leave open to air with a sock and may cover with a Band-Aid during the day at the left second toe -Advised to continue with shoes that do not rub or irritate toes and be careful not to rebumped the toes -Continue with Keflex as provided by PCP - Advised patient to go to the ER or return to office if the wound worsens or if constitutional symptoms are present. -At no additional charge mechanically debrided nails x10 -Patient to return to office in 2-3 weeks for follow up care and evaluation or sooner if problems arise.  Landis Martins, DPM

## 2019-07-03 ENCOUNTER — Ambulatory Visit: Payer: Self-pay | Admitting: Podiatry

## 2019-07-21 ENCOUNTER — Ambulatory Visit: Payer: Medicare PPO | Admitting: Sports Medicine

## 2019-07-21 ENCOUNTER — Other Ambulatory Visit: Payer: Self-pay

## 2019-07-21 DIAGNOSIS — L97521 Non-pressure chronic ulcer of other part of left foot limited to breakdown of skin: Secondary | ICD-10-CM | POA: Diagnosis not present

## 2019-07-21 DIAGNOSIS — L03032 Cellulitis of left toe: Secondary | ICD-10-CM

## 2019-07-21 DIAGNOSIS — E119 Type 2 diabetes mellitus without complications: Secondary | ICD-10-CM

## 2019-07-21 DIAGNOSIS — L02612 Cutaneous abscess of left foot: Secondary | ICD-10-CM

## 2019-07-21 DIAGNOSIS — I739 Peripheral vascular disease, unspecified: Secondary | ICD-10-CM

## 2019-07-21 MED ORDER — CEPHALEXIN 500 MG PO CAPS: 500.0000 mg | ORAL_CAPSULE | Freq: Three times a day (TID) | ORAL | 0 refills | Status: AC

## 2019-07-21 NOTE — Progress Notes (Signed)
Subjective: Marilyn Stone is a 86 y.o. female patient seen in office for follow up evaluation of ulceration of the left first and second toes. Patient is assisted by daughter-in-law who reports that they are fine but did have a few days the toes were red and wants a refill on Keflex. Reports she has been helping with dressings by cleaning with betadine and then applying antibiotic ointment to the toes on the left. Denies any other pedal complaints at this time.   Patient Active Problem List   Diagnosis Date Noted  . AKI (acute kidney injury) (HCC) 11/09/2012  . Diarrhea 11/09/2012  . Near syncope 11/09/2012  . DM (diabetes mellitus) (HCC) 11/09/2012  . Transient hypotension 11/09/2012   Current Outpatient Medications on File Prior to Visit  Medication Sig Dispense Refill  . citalopram (CELEXA) 40 MG tablet Take 40 mg by mouth daily.    . Cyanocobalamin 1000 MCG/ML LIQD Take 1,000 mcg/mL by mouth every 30 (thirty) days.    Marland Kitchen donepezil (ARICEPT) 10 MG tablet Take 10 mg by mouth daily.    . enalapril (VASOTEC) 20 MG tablet     . furosemide (LASIX) 40 MG tablet Take 0.5 tablets (20 mg total) by mouth daily. 30 tablet 1  . glimepiride (AMARYL) 4 MG tablet Take 4 mg by mouth 2 (two) times daily.    Marland Kitchen glucosamine-chondroitin 500-400 MG tablet Take 1 tablet by mouth 4 (four) times daily.    Marland Kitchen ibuprofen (ADVIL,MOTRIN) 200 MG tablet Take 200 mg by mouth every 6 (six) hours as needed for pain.    Marland Kitchen lovastatin (MEVACOR) 40 MG tablet Take 40 mg by mouth 2 (two) times daily.    . meclizine (ANTIVERT) 25 MG tablet Take 25 mg by mouth 3 (three) times daily.    . memantine (NAMENDA) 10 MG tablet     . Memantine HCl ER (NAMENDA XR) 28 MG CP24 Take 28 mg by mouth daily.    . metFORMIN (GLUMETZA) 500 MG (MOD) 24 hr tablet Take 500 mg by mouth daily with breakfast.    . metoprolol succinate (TOPROL-XL) 50 MG 24 hr tablet Take 1 tablet (50 mg total) by mouth 2 (two) times daily. Take with or immediately  following a meal. 60 tablet 2  . Multiple Vitamins-Minerals (CENTRUM SILVER PO) Take 1 tablet by mouth daily.    . mupirocin ointment (BACTROBAN) 2 %     . Omega-3 Fatty Acids (FISH OIL PO) Take 1 tablet by mouth daily.    Marland Kitchen omeprazole (PRILOSEC) 20 MG capsule Take 20 mg by mouth daily.    . polyethylene glycol (MIRALAX / GLYCOLAX) packet Take 17 g by mouth daily as needed (constipation).    . sitaGLIPtin (JANUVIA) 100 MG tablet Take 50 mg by mouth daily.     No current facility-administered medications on file prior to visit.   No Known Allergies  No results found for this or any previous visit (from the past 2160 hour(s)).  Objective: There were no vitals filed for this visit.  General: Patient is awake, alert, oriented x 3 and in no acute distress.  Dermatology: Skin is warm and dry bilateral with a partial thickness ulceration present left 2nd toe. Ulceration measures 0.1 x 0.1 x 0.1 cm at the left second toe with a granular base and dry peeling skin along the margins.  There is no malodor, no active drainage, no significant erythema, minimal edema. No other acute signs of infection.  Previous ulceration at the left first toe  remains HEALED.  Nails are thickened and short x10.   Vascular: Dorsalis Pedis pulse = 1/4 Bilateral,  Posterior Tibial pulse = 0/4 Bilateral,  Capillary Fill Time < 5 seconds  Neurologic: Gross sensation present via light touch.   Musculosketal: Minimal to no pain to palpation to ulcerated area left foot.  Significant bunion and hammertoe deformity.  No other acute findings.   No results for input(s): GRAMSTAIN, LABORGA in the last 8760 hours.  Assessment and Plan:  Problem List Items Addressed This Visit    None    Visit Diagnoses    Toe ulcer, left, limited to breakdown of skin ([place])    -  Primary   Cellulitis and abscess of toe of left foot       Diabetes mellitus without complication ([place])       PVD (peripheral vascular disease) ([place])          -Examined patient and discussed the progression of the wound and treatment alternatives with daughter in law - Excisionally dedbrided ulceration at left second toe to healthy bleeding borders removing nonviable tissue using a sterile chisel blade. Wound measures post debridement as above wound was debrided to the level of the dermis with viable wound base exposed to promote healing. Hemostasis was achieved with manuel pressure. Patient tolerated procedure well without any discomfort or anesthesia necessary for this wound debridement.  -Applied betadine and Band-Aid and advised family to do the same -Refill as a courtsey Keflex to take if there is redness on the toes however no acute concerns noted this visit  - Advised patient to go to the ER or return to office if the wound worsens or if constitutional symptoms are present. -Patient to return to office in 4-6  weeks for follow up care and evaluation or sooner if problems arise.  Landis Martins, DPM

## 2019-07-22 ENCOUNTER — Encounter: Payer: Self-pay | Admitting: Sports Medicine

## 2019-08-18 ENCOUNTER — Ambulatory Visit: Payer: Medicare PPO | Admitting: Sports Medicine

## 2020-01-04 DIAGNOSIS — G309 Alzheimer's disease, unspecified: Secondary | ICD-10-CM | POA: Diagnosis not present

## 2020-01-04 DIAGNOSIS — I10 Essential (primary) hypertension: Secondary | ICD-10-CM | POA: Diagnosis not present

## 2020-01-04 DIAGNOSIS — R2689 Other abnormalities of gait and mobility: Secondary | ICD-10-CM | POA: Diagnosis not present

## 2020-01-05 DIAGNOSIS — R6889 Other general symptoms and signs: Secondary | ICD-10-CM | POA: Diagnosis not present

## 2020-01-05 DIAGNOSIS — R7309 Other abnormal glucose: Secondary | ICD-10-CM | POA: Diagnosis not present

## 2020-01-15 DIAGNOSIS — Z23 Encounter for immunization: Secondary | ICD-10-CM | POA: Diagnosis not present

## 2020-02-08 DIAGNOSIS — G309 Alzheimer's disease, unspecified: Secondary | ICD-10-CM | POA: Diagnosis not present

## 2020-02-08 DIAGNOSIS — I10 Essential (primary) hypertension: Secondary | ICD-10-CM | POA: Diagnosis not present

## 2020-03-08 DIAGNOSIS — I10 Essential (primary) hypertension: Secondary | ICD-10-CM | POA: Diagnosis not present

## 2020-03-08 DIAGNOSIS — G309 Alzheimer's disease, unspecified: Secondary | ICD-10-CM | POA: Diagnosis not present

## 2020-04-11 DIAGNOSIS — E440 Moderate protein-calorie malnutrition: Secondary | ICD-10-CM | POA: Diagnosis not present

## 2020-04-11 DIAGNOSIS — G309 Alzheimer's disease, unspecified: Secondary | ICD-10-CM | POA: Diagnosis not present

## 2020-04-11 DIAGNOSIS — I10 Essential (primary) hypertension: Secondary | ICD-10-CM | POA: Diagnosis not present

## 2020-04-17 DIAGNOSIS — G301 Alzheimer's disease with late onset: Secondary | ICD-10-CM | POA: Diagnosis not present

## 2020-04-17 DIAGNOSIS — F0281 Dementia in other diseases classified elsewhere with behavioral disturbance: Secondary | ICD-10-CM | POA: Diagnosis not present

## 2020-04-25 DIAGNOSIS — B351 Tinea unguium: Secondary | ICD-10-CM | POA: Diagnosis not present

## 2020-04-25 DIAGNOSIS — R262 Difficulty in walking, not elsewhere classified: Secondary | ICD-10-CM | POA: Diagnosis not present

## 2020-04-25 DIAGNOSIS — L603 Nail dystrophy: Secondary | ICD-10-CM | POA: Diagnosis not present

## 2020-04-25 DIAGNOSIS — I739 Peripheral vascular disease, unspecified: Secondary | ICD-10-CM | POA: Diagnosis not present

## 2020-04-25 DIAGNOSIS — L853 Xerosis cutis: Secondary | ICD-10-CM | POA: Diagnosis not present

## 2020-04-26 DIAGNOSIS — R7309 Other abnormal glucose: Secondary | ICD-10-CM | POA: Diagnosis not present

## 2020-04-26 DIAGNOSIS — R6889 Other general symptoms and signs: Secondary | ICD-10-CM | POA: Diagnosis not present

## 2020-04-26 DIAGNOSIS — I10 Essential (primary) hypertension: Secondary | ICD-10-CM | POA: Diagnosis not present

## 2020-04-26 DIAGNOSIS — E119 Type 2 diabetes mellitus without complications: Secondary | ICD-10-CM | POA: Diagnosis not present

## 2020-04-26 DIAGNOSIS — I4891 Unspecified atrial fibrillation: Secondary | ICD-10-CM | POA: Diagnosis not present

## 2020-04-26 DIAGNOSIS — D509 Iron deficiency anemia, unspecified: Secondary | ICD-10-CM | POA: Diagnosis not present

## 2020-04-26 DIAGNOSIS — D649 Anemia, unspecified: Secondary | ICD-10-CM | POA: Diagnosis not present

## 2020-05-15 DIAGNOSIS — F339 Major depressive disorder, recurrent, unspecified: Secondary | ICD-10-CM | POA: Diagnosis not present

## 2020-05-15 DIAGNOSIS — G301 Alzheimer's disease with late onset: Secondary | ICD-10-CM | POA: Diagnosis not present

## 2020-05-15 DIAGNOSIS — F0281 Dementia in other diseases classified elsewhere with behavioral disturbance: Secondary | ICD-10-CM | POA: Diagnosis not present

## 2020-05-17 DIAGNOSIS — E440 Moderate protein-calorie malnutrition: Secondary | ICD-10-CM | POA: Diagnosis not present

## 2020-05-17 DIAGNOSIS — I10 Essential (primary) hypertension: Secondary | ICD-10-CM | POA: Diagnosis not present

## 2020-05-17 DIAGNOSIS — G309 Alzheimer's disease, unspecified: Secondary | ICD-10-CM | POA: Diagnosis not present

## 2020-05-20 DIAGNOSIS — Z20822 Contact with and (suspected) exposure to covid-19: Secondary | ICD-10-CM | POA: Diagnosis not present

## 2020-05-22 DIAGNOSIS — Z23 Encounter for immunization: Secondary | ICD-10-CM | POA: Diagnosis not present

## 2020-05-22 DIAGNOSIS — G309 Alzheimer's disease, unspecified: Secondary | ICD-10-CM | POA: Diagnosis not present

## 2020-05-22 DIAGNOSIS — E119 Type 2 diabetes mellitus without complications: Secondary | ICD-10-CM | POA: Diagnosis not present

## 2020-05-22 DIAGNOSIS — I10 Essential (primary) hypertension: Secondary | ICD-10-CM | POA: Diagnosis not present

## 2020-05-22 DIAGNOSIS — R2689 Other abnormalities of gait and mobility: Secondary | ICD-10-CM | POA: Diagnosis not present

## 2020-06-06 DIAGNOSIS — E440 Moderate protein-calorie malnutrition: Secondary | ICD-10-CM | POA: Diagnosis not present

## 2020-06-06 DIAGNOSIS — G309 Alzheimer's disease, unspecified: Secondary | ICD-10-CM | POA: Diagnosis not present

## 2020-06-06 DIAGNOSIS — I10 Essential (primary) hypertension: Secondary | ICD-10-CM | POA: Diagnosis not present

## 2020-06-18 DIAGNOSIS — H524 Presbyopia: Secondary | ICD-10-CM | POA: Diagnosis not present

## 2020-06-18 DIAGNOSIS — Z961 Presence of intraocular lens: Secondary | ICD-10-CM | POA: Diagnosis not present

## 2020-06-18 DIAGNOSIS — E119 Type 2 diabetes mellitus without complications: Secondary | ICD-10-CM | POA: Diagnosis not present

## 2020-06-18 DIAGNOSIS — Z7984 Long term (current) use of oral hypoglycemic drugs: Secondary | ICD-10-CM | POA: Diagnosis not present

## 2020-06-19 DIAGNOSIS — F0281 Dementia in other diseases classified elsewhere with behavioral disturbance: Secondary | ICD-10-CM | POA: Diagnosis not present

## 2020-06-19 DIAGNOSIS — G301 Alzheimer's disease with late onset: Secondary | ICD-10-CM | POA: Diagnosis not present

## 2020-06-19 DIAGNOSIS — F339 Major depressive disorder, recurrent, unspecified: Secondary | ICD-10-CM | POA: Diagnosis not present

## 2020-07-05 DIAGNOSIS — I4891 Unspecified atrial fibrillation: Secondary | ICD-10-CM | POA: Diagnosis not present

## 2020-07-05 DIAGNOSIS — D649 Anemia, unspecified: Secondary | ICD-10-CM | POA: Diagnosis not present

## 2020-07-05 DIAGNOSIS — D509 Iron deficiency anemia, unspecified: Secondary | ICD-10-CM | POA: Diagnosis not present

## 2020-07-05 DIAGNOSIS — E440 Moderate protein-calorie malnutrition: Secondary | ICD-10-CM | POA: Diagnosis not present

## 2020-07-05 DIAGNOSIS — E119 Type 2 diabetes mellitus without complications: Secondary | ICD-10-CM | POA: Diagnosis not present

## 2020-07-05 DIAGNOSIS — I10 Essential (primary) hypertension: Secondary | ICD-10-CM | POA: Diagnosis not present

## 2020-07-05 DIAGNOSIS — G309 Alzheimer's disease, unspecified: Secondary | ICD-10-CM | POA: Diagnosis not present

## 2020-07-17 DIAGNOSIS — F0281 Dementia in other diseases classified elsewhere with behavioral disturbance: Secondary | ICD-10-CM | POA: Diagnosis not present

## 2020-07-17 DIAGNOSIS — F339 Major depressive disorder, recurrent, unspecified: Secondary | ICD-10-CM | POA: Diagnosis not present

## 2020-07-17 DIAGNOSIS — G301 Alzheimer's disease with late onset: Secondary | ICD-10-CM | POA: Diagnosis not present

## 2020-07-29 DIAGNOSIS — Z20822 Contact with and (suspected) exposure to covid-19: Secondary | ICD-10-CM | POA: Diagnosis not present

## 2020-08-04 DIAGNOSIS — U071 COVID-19: Secondary | ICD-10-CM | POA: Diagnosis not present

## 2020-08-08 DIAGNOSIS — I10 Essential (primary) hypertension: Secondary | ICD-10-CM | POA: Diagnosis not present

## 2020-08-08 DIAGNOSIS — U071 COVID-19: Secondary | ICD-10-CM | POA: Diagnosis not present

## 2020-08-08 DIAGNOSIS — E440 Moderate protein-calorie malnutrition: Secondary | ICD-10-CM | POA: Diagnosis not present

## 2020-08-08 DIAGNOSIS — G309 Alzheimer's disease, unspecified: Secondary | ICD-10-CM | POA: Diagnosis not present

## 2020-08-14 DIAGNOSIS — G301 Alzheimer's disease with late onset: Secondary | ICD-10-CM | POA: Diagnosis not present

## 2020-08-14 DIAGNOSIS — F339 Major depressive disorder, recurrent, unspecified: Secondary | ICD-10-CM | POA: Diagnosis not present

## 2020-08-14 DIAGNOSIS — F0281 Dementia in other diseases classified elsewhere with behavioral disturbance: Secondary | ICD-10-CM | POA: Diagnosis not present

## 2020-09-03 DIAGNOSIS — F0281 Dementia in other diseases classified elsewhere with behavioral disturbance: Secondary | ICD-10-CM | POA: Diagnosis not present

## 2020-09-03 DIAGNOSIS — G301 Alzheimer's disease with late onset: Secondary | ICD-10-CM | POA: Diagnosis not present

## 2020-09-06 DIAGNOSIS — G309 Alzheimer's disease, unspecified: Secondary | ICD-10-CM | POA: Diagnosis not present

## 2020-09-06 DIAGNOSIS — E440 Moderate protein-calorie malnutrition: Secondary | ICD-10-CM | POA: Diagnosis not present

## 2020-09-06 DIAGNOSIS — I10 Essential (primary) hypertension: Secondary | ICD-10-CM | POA: Diagnosis not present

## 2020-09-12 DIAGNOSIS — R262 Difficulty in walking, not elsewhere classified: Secondary | ICD-10-CM | POA: Diagnosis not present

## 2020-09-12 DIAGNOSIS — Z7984 Long term (current) use of oral hypoglycemic drugs: Secondary | ICD-10-CM | POA: Diagnosis not present

## 2020-09-12 DIAGNOSIS — B351 Tinea unguium: Secondary | ICD-10-CM | POA: Diagnosis not present

## 2020-09-12 DIAGNOSIS — E119 Type 2 diabetes mellitus without complications: Secondary | ICD-10-CM | POA: Diagnosis not present

## 2020-10-11 DIAGNOSIS — I10 Essential (primary) hypertension: Secondary | ICD-10-CM | POA: Diagnosis not present

## 2020-10-11 DIAGNOSIS — E440 Moderate protein-calorie malnutrition: Secondary | ICD-10-CM | POA: Diagnosis not present

## 2020-10-11 DIAGNOSIS — G309 Alzheimer's disease, unspecified: Secondary | ICD-10-CM | POA: Diagnosis not present

## 2020-10-30 DIAGNOSIS — G301 Alzheimer's disease with late onset: Secondary | ICD-10-CM | POA: Diagnosis not present

## 2020-10-30 DIAGNOSIS — F339 Major depressive disorder, recurrent, unspecified: Secondary | ICD-10-CM | POA: Diagnosis not present

## 2020-10-30 DIAGNOSIS — F0281 Dementia in other diseases classified elsewhere with behavioral disturbance: Secondary | ICD-10-CM | POA: Diagnosis not present

## 2020-11-01 DIAGNOSIS — R6889 Other general symptoms and signs: Secondary | ICD-10-CM | POA: Diagnosis not present

## 2020-11-01 DIAGNOSIS — D509 Iron deficiency anemia, unspecified: Secondary | ICD-10-CM | POA: Diagnosis not present

## 2020-11-01 DIAGNOSIS — D649 Anemia, unspecified: Secondary | ICD-10-CM | POA: Diagnosis not present

## 2020-11-01 DIAGNOSIS — R7309 Other abnormal glucose: Secondary | ICD-10-CM | POA: Diagnosis not present

## 2020-11-01 DIAGNOSIS — E119 Type 2 diabetes mellitus without complications: Secondary | ICD-10-CM | POA: Diagnosis not present

## 2020-11-01 DIAGNOSIS — I4891 Unspecified atrial fibrillation: Secondary | ICD-10-CM | POA: Diagnosis not present

## 2020-11-01 DIAGNOSIS — I10 Essential (primary) hypertension: Secondary | ICD-10-CM | POA: Diagnosis not present

## 2020-11-07 DIAGNOSIS — I160 Hypertensive urgency: Secondary | ICD-10-CM | POA: Diagnosis not present

## 2020-11-07 DIAGNOSIS — E1151 Type 2 diabetes mellitus with diabetic peripheral angiopathy without gangrene: Secondary | ICD-10-CM | POA: Diagnosis not present

## 2020-11-07 DIAGNOSIS — F0281 Dementia in other diseases classified elsewhere with behavioral disturbance: Secondary | ICD-10-CM | POA: Diagnosis not present

## 2020-11-07 DIAGNOSIS — G309 Alzheimer's disease, unspecified: Secondary | ICD-10-CM | POA: Diagnosis not present

## 2020-11-27 DIAGNOSIS — F0281 Dementia in other diseases classified elsewhere with behavioral disturbance: Secondary | ICD-10-CM | POA: Diagnosis not present

## 2020-11-27 DIAGNOSIS — F339 Major depressive disorder, recurrent, unspecified: Secondary | ICD-10-CM | POA: Diagnosis not present

## 2020-11-27 DIAGNOSIS — G301 Alzheimer's disease with late onset: Secondary | ICD-10-CM | POA: Diagnosis not present

## 2021-08-04 DEATH — deceased
